# Patient Record
Sex: Male | Born: 1975 | Race: White | Hispanic: No | Marital: Married | Smoking: Former smoker
Health system: Southern US, Academic
[De-identification: ages and names within clinical notes are randomized; demographics above are authoritative.]

## PROBLEM LIST (undated history)

## (undated) DIAGNOSIS — F32A Depression, unspecified: Secondary | ICD-10-CM

## (undated) DIAGNOSIS — J45909 Unspecified asthma, uncomplicated: Secondary | ICD-10-CM

## (undated) DIAGNOSIS — K219 Gastro-esophageal reflux disease without esophagitis: Secondary | ICD-10-CM

## (undated) DIAGNOSIS — G4730 Sleep apnea, unspecified: Secondary | ICD-10-CM

## (undated) DIAGNOSIS — I10 Essential (primary) hypertension: Secondary | ICD-10-CM

## (undated) DIAGNOSIS — F419 Anxiety disorder, unspecified: Secondary | ICD-10-CM

## (undated) DIAGNOSIS — K760 Fatty (change of) liver, not elsewhere classified: Secondary | ICD-10-CM

## (undated) DIAGNOSIS — G8929 Other chronic pain: Secondary | ICD-10-CM

## (undated) DIAGNOSIS — J189 Pneumonia, unspecified organism: Secondary | ICD-10-CM

## (undated) DIAGNOSIS — M1990 Unspecified osteoarthritis, unspecified site: Secondary | ICD-10-CM

## (undated) DIAGNOSIS — G709 Myoneural disorder, unspecified: Secondary | ICD-10-CM

## (undated) HISTORY — DX: Gastro-esophageal reflux disease without esophagitis: K21.9

## (undated) HISTORY — PX: SHOULDER SURGERY: SHX246

## (undated) HISTORY — PX: TONSILLECTOMY: SUR1361

## (undated) HISTORY — DX: Unspecified asthma, uncomplicated: J45.909

## (undated) HISTORY — DX: Sleep apnea, unspecified: G47.30

## (undated) HISTORY — DX: Other chronic pain: G89.29

## (undated) HISTORY — DX: Essential (primary) hypertension: I10

## (undated) HISTORY — PX: SHOULDER OPEN ROTATOR CUFF REPAIR: SHX2407

## (undated) HISTORY — PX: HX GALL BLADDER SURGERY/CHOLE: SHX55

## (undated) HISTORY — DX: Depression, unspecified: F32.A

## (undated) HISTORY — DX: Anxiety disorder, unspecified: F41.9

## (undated) HISTORY — PX: HX KNEE REPLACMENT: SHX125

---

## 2000-06-11 ENCOUNTER — Inpatient Hospital Stay (HOSPITAL_COMMUNITY): Admission: RE | Admit: 2000-06-11 | Discharge: 2000-06-12 | Payer: Self-pay | Admitting: Orthopaedic Surgery

## 2016-07-22 DIAGNOSIS — I10 Essential (primary) hypertension: Secondary | ICD-10-CM | POA: Diagnosis not present

## 2016-07-22 DIAGNOSIS — Z6837 Body mass index (BMI) 37.0-37.9, adult: Secondary | ICD-10-CM | POA: Diagnosis not present

## 2016-07-22 DIAGNOSIS — J45909 Unspecified asthma, uncomplicated: Secondary | ICD-10-CM | POA: Diagnosis not present

## 2016-07-22 DIAGNOSIS — K219 Gastro-esophageal reflux disease without esophagitis: Secondary | ICD-10-CM | POA: Diagnosis not present

## 2016-08-12 DIAGNOSIS — I10 Essential (primary) hypertension: Secondary | ICD-10-CM | POA: Diagnosis not present

## 2016-08-12 DIAGNOSIS — Z6836 Body mass index (BMI) 36.0-36.9, adult: Secondary | ICD-10-CM | POA: Diagnosis not present

## 2016-11-05 DIAGNOSIS — Z6835 Body mass index (BMI) 35.0-35.9, adult: Secondary | ICD-10-CM | POA: Diagnosis not present

## 2016-11-05 DIAGNOSIS — G4733 Obstructive sleep apnea (adult) (pediatric): Secondary | ICD-10-CM | POA: Diagnosis not present

## 2016-11-05 DIAGNOSIS — Z1389 Encounter for screening for other disorder: Secondary | ICD-10-CM | POA: Diagnosis not present

## 2016-11-05 DIAGNOSIS — J45909 Unspecified asthma, uncomplicated: Secondary | ICD-10-CM | POA: Diagnosis not present

## 2016-11-05 DIAGNOSIS — I10 Essential (primary) hypertension: Secondary | ICD-10-CM | POA: Diagnosis not present

## 2017-05-02 DIAGNOSIS — K219 Gastro-esophageal reflux disease without esophagitis: Secondary | ICD-10-CM | POA: Diagnosis not present

## 2017-05-02 DIAGNOSIS — I10 Essential (primary) hypertension: Secondary | ICD-10-CM | POA: Diagnosis not present

## 2017-05-02 DIAGNOSIS — R030 Elevated blood-pressure reading, without diagnosis of hypertension: Secondary | ICD-10-CM | POA: Diagnosis not present

## 2017-05-02 DIAGNOSIS — G4733 Obstructive sleep apnea (adult) (pediatric): Secondary | ICD-10-CM | POA: Diagnosis not present

## 2017-05-06 DIAGNOSIS — G4733 Obstructive sleep apnea (adult) (pediatric): Secondary | ICD-10-CM | POA: Diagnosis not present

## 2017-05-06 DIAGNOSIS — I10 Essential (primary) hypertension: Secondary | ICD-10-CM | POA: Diagnosis not present

## 2017-05-06 DIAGNOSIS — Z6835 Body mass index (BMI) 35.0-35.9, adult: Secondary | ICD-10-CM | POA: Diagnosis not present

## 2017-05-06 DIAGNOSIS — J45909 Unspecified asthma, uncomplicated: Secondary | ICD-10-CM | POA: Diagnosis not present

## 2017-08-08 DIAGNOSIS — J309 Allergic rhinitis, unspecified: Secondary | ICD-10-CM | POA: Diagnosis not present

## 2017-08-08 DIAGNOSIS — Z6836 Body mass index (BMI) 36.0-36.9, adult: Secondary | ICD-10-CM | POA: Diagnosis not present

## 2017-08-08 DIAGNOSIS — K219 Gastro-esophageal reflux disease without esophagitis: Secondary | ICD-10-CM | POA: Diagnosis not present

## 2017-08-08 DIAGNOSIS — I10 Essential (primary) hypertension: Secondary | ICD-10-CM | POA: Diagnosis not present

## 2017-09-11 ENCOUNTER — Ambulatory Visit (INDEPENDENT_AMBULATORY_CARE_PROVIDER_SITE_OTHER): Payer: BLUE CROSS/BLUE SHIELD | Admitting: Otolaryngology

## 2017-09-11 DIAGNOSIS — H900 Conductive hearing loss, bilateral: Secondary | ICD-10-CM

## 2018-01-30 DIAGNOSIS — R300 Dysuria: Secondary | ICD-10-CM | POA: Diagnosis not present

## 2018-01-30 DIAGNOSIS — Z6831 Body mass index (BMI) 31.0-31.9, adult: Secondary | ICD-10-CM | POA: Diagnosis not present

## 2018-01-30 DIAGNOSIS — R071 Chest pain on breathing: Secondary | ICD-10-CM | POA: Diagnosis not present

## 2018-02-02 DIAGNOSIS — Z6831 Body mass index (BMI) 31.0-31.9, adult: Secondary | ICD-10-CM | POA: Diagnosis not present

## 2018-02-02 DIAGNOSIS — R071 Chest pain on breathing: Secondary | ICD-10-CM | POA: Diagnosis not present

## 2018-02-03 DIAGNOSIS — R0602 Shortness of breath: Secondary | ICD-10-CM | POA: Diagnosis not present

## 2018-02-03 DIAGNOSIS — J8410 Pulmonary fibrosis, unspecified: Secondary | ICD-10-CM | POA: Diagnosis not present

## 2018-02-03 DIAGNOSIS — S299XXA Unspecified injury of thorax, initial encounter: Secondary | ICD-10-CM | POA: Diagnosis not present

## 2018-05-04 DIAGNOSIS — G4733 Obstructive sleep apnea (adult) (pediatric): Secondary | ICD-10-CM | POA: Diagnosis not present

## 2018-05-04 DIAGNOSIS — I10 Essential (primary) hypertension: Secondary | ICD-10-CM | POA: Diagnosis not present

## 2018-05-04 DIAGNOSIS — K219 Gastro-esophageal reflux disease without esophagitis: Secondary | ICD-10-CM | POA: Diagnosis not present

## 2018-05-04 DIAGNOSIS — R030 Elevated blood-pressure reading, without diagnosis of hypertension: Secondary | ICD-10-CM | POA: Diagnosis not present

## 2018-05-06 DIAGNOSIS — F419 Anxiety disorder, unspecified: Secondary | ICD-10-CM | POA: Diagnosis not present

## 2018-05-06 DIAGNOSIS — I10 Essential (primary) hypertension: Secondary | ICD-10-CM | POA: Diagnosis not present

## 2018-05-06 DIAGNOSIS — G252 Other specified forms of tremor: Secondary | ICD-10-CM | POA: Diagnosis not present

## 2018-05-06 DIAGNOSIS — Z1331 Encounter for screening for depression: Secondary | ICD-10-CM | POA: Diagnosis not present

## 2018-05-06 DIAGNOSIS — N529 Male erectile dysfunction, unspecified: Secondary | ICD-10-CM | POA: Diagnosis not present

## 2018-05-06 DIAGNOSIS — J45909 Unspecified asthma, uncomplicated: Secondary | ICD-10-CM | POA: Diagnosis not present

## 2018-06-18 DIAGNOSIS — Z6834 Body mass index (BMI) 34.0-34.9, adult: Secondary | ICD-10-CM | POA: Diagnosis not present

## 2018-06-18 DIAGNOSIS — N529 Male erectile dysfunction, unspecified: Secondary | ICD-10-CM | POA: Diagnosis not present

## 2018-06-18 DIAGNOSIS — F419 Anxiety disorder, unspecified: Secondary | ICD-10-CM | POA: Diagnosis not present

## 2018-06-18 DIAGNOSIS — K219 Gastro-esophageal reflux disease without esophagitis: Secondary | ICD-10-CM | POA: Diagnosis not present

## 2018-06-18 DIAGNOSIS — R030 Elevated blood-pressure reading, without diagnosis of hypertension: Secondary | ICD-10-CM | POA: Diagnosis not present

## 2018-10-13 DIAGNOSIS — S134XXA Sprain of ligaments of cervical spine, initial encounter: Secondary | ICD-10-CM | POA: Diagnosis not present

## 2018-10-13 DIAGNOSIS — M546 Pain in thoracic spine: Secondary | ICD-10-CM | POA: Diagnosis not present

## 2018-10-13 DIAGNOSIS — S39012A Strain of muscle, fascia and tendon of lower back, initial encounter: Secondary | ICD-10-CM | POA: Diagnosis not present

## 2018-10-15 DIAGNOSIS — S134XXA Sprain of ligaments of cervical spine, initial encounter: Secondary | ICD-10-CM | POA: Diagnosis not present

## 2018-10-15 DIAGNOSIS — M546 Pain in thoracic spine: Secondary | ICD-10-CM | POA: Diagnosis not present

## 2018-10-15 DIAGNOSIS — S39012A Strain of muscle, fascia and tendon of lower back, initial encounter: Secondary | ICD-10-CM | POA: Diagnosis not present

## 2018-10-21 DIAGNOSIS — S39012A Strain of muscle, fascia and tendon of lower back, initial encounter: Secondary | ICD-10-CM | POA: Diagnosis not present

## 2018-10-21 DIAGNOSIS — M546 Pain in thoracic spine: Secondary | ICD-10-CM | POA: Diagnosis not present

## 2018-10-21 DIAGNOSIS — S233XXA Sprain of ligaments of thoracic spine, initial encounter: Secondary | ICD-10-CM | POA: Diagnosis not present

## 2018-10-21 DIAGNOSIS — M4317 Spondylolisthesis, lumbosacral region: Secondary | ICD-10-CM | POA: Diagnosis not present

## 2018-10-21 DIAGNOSIS — S134XXA Sprain of ligaments of cervical spine, initial encounter: Secondary | ICD-10-CM | POA: Diagnosis not present

## 2018-10-26 DIAGNOSIS — M546 Pain in thoracic spine: Secondary | ICD-10-CM | POA: Diagnosis not present

## 2018-10-26 DIAGNOSIS — S39012A Strain of muscle, fascia and tendon of lower back, initial encounter: Secondary | ICD-10-CM | POA: Diagnosis not present

## 2018-10-26 DIAGNOSIS — S233XXA Sprain of ligaments of thoracic spine, initial encounter: Secondary | ICD-10-CM | POA: Diagnosis not present

## 2018-10-26 DIAGNOSIS — S134XXA Sprain of ligaments of cervical spine, initial encounter: Secondary | ICD-10-CM | POA: Diagnosis not present

## 2018-10-26 DIAGNOSIS — M4317 Spondylolisthesis, lumbosacral region: Secondary | ICD-10-CM | POA: Diagnosis not present

## 2018-10-29 DIAGNOSIS — M4317 Spondylolisthesis, lumbosacral region: Secondary | ICD-10-CM | POA: Diagnosis not present

## 2018-10-29 DIAGNOSIS — S134XXA Sprain of ligaments of cervical spine, initial encounter: Secondary | ICD-10-CM | POA: Diagnosis not present

## 2018-10-29 DIAGNOSIS — S233XXA Sprain of ligaments of thoracic spine, initial encounter: Secondary | ICD-10-CM | POA: Diagnosis not present

## 2018-11-02 DIAGNOSIS — S134XXA Sprain of ligaments of cervical spine, initial encounter: Secondary | ICD-10-CM | POA: Diagnosis not present

## 2018-11-02 DIAGNOSIS — S233XXA Sprain of ligaments of thoracic spine, initial encounter: Secondary | ICD-10-CM | POA: Diagnosis not present

## 2018-11-02 DIAGNOSIS — M4317 Spondylolisthesis, lumbosacral region: Secondary | ICD-10-CM | POA: Diagnosis not present

## 2018-11-04 DIAGNOSIS — S39012A Strain of muscle, fascia and tendon of lower back, initial encounter: Secondary | ICD-10-CM | POA: Diagnosis not present

## 2018-11-04 DIAGNOSIS — M546 Pain in thoracic spine: Secondary | ICD-10-CM | POA: Diagnosis not present

## 2018-11-04 DIAGNOSIS — S134XXA Sprain of ligaments of cervical spine, initial encounter: Secondary | ICD-10-CM | POA: Diagnosis not present

## 2018-11-05 DIAGNOSIS — M4317 Spondylolisthesis, lumbosacral region: Secondary | ICD-10-CM | POA: Diagnosis not present

## 2018-11-05 DIAGNOSIS — S134XXA Sprain of ligaments of cervical spine, initial encounter: Secondary | ICD-10-CM | POA: Diagnosis not present

## 2018-11-05 DIAGNOSIS — S233XXA Sprain of ligaments of thoracic spine, initial encounter: Secondary | ICD-10-CM | POA: Diagnosis not present

## 2018-12-17 DIAGNOSIS — I10 Essential (primary) hypertension: Secondary | ICD-10-CM | POA: Diagnosis not present

## 2018-12-17 DIAGNOSIS — K219 Gastro-esophageal reflux disease without esophagitis: Secondary | ICD-10-CM | POA: Diagnosis not present

## 2018-12-17 DIAGNOSIS — G4733 Obstructive sleep apnea (adult) (pediatric): Secondary | ICD-10-CM | POA: Diagnosis not present

## 2018-12-22 DIAGNOSIS — F419 Anxiety disorder, unspecified: Secondary | ICD-10-CM | POA: Diagnosis not present

## 2018-12-22 DIAGNOSIS — N529 Male erectile dysfunction, unspecified: Secondary | ICD-10-CM | POA: Diagnosis not present

## 2018-12-22 DIAGNOSIS — Z6833 Body mass index (BMI) 33.0-33.9, adult: Secondary | ICD-10-CM | POA: Diagnosis not present

## 2019-05-06 DIAGNOSIS — Z13220 Encounter for screening for lipoid disorders: Secondary | ICD-10-CM | POA: Diagnosis not present

## 2019-05-06 DIAGNOSIS — G4733 Obstructive sleep apnea (adult) (pediatric): Secondary | ICD-10-CM | POA: Diagnosis not present

## 2019-05-06 DIAGNOSIS — I10 Essential (primary) hypertension: Secondary | ICD-10-CM | POA: Diagnosis not present

## 2019-05-06 DIAGNOSIS — K219 Gastro-esophageal reflux disease without esophagitis: Secondary | ICD-10-CM | POA: Diagnosis not present

## 2019-05-06 DIAGNOSIS — F419 Anxiety disorder, unspecified: Secondary | ICD-10-CM | POA: Diagnosis not present

## 2019-05-11 DIAGNOSIS — Z1389 Encounter for screening for other disorder: Secondary | ICD-10-CM | POA: Diagnosis not present

## 2019-05-11 DIAGNOSIS — Z6834 Body mass index (BMI) 34.0-34.9, adult: Secondary | ICD-10-CM | POA: Diagnosis not present

## 2019-05-11 DIAGNOSIS — N529 Male erectile dysfunction, unspecified: Secondary | ICD-10-CM | POA: Diagnosis not present

## 2019-05-11 DIAGNOSIS — Z1331 Encounter for screening for depression: Secondary | ICD-10-CM | POA: Diagnosis not present

## 2019-05-11 DIAGNOSIS — F419 Anxiety disorder, unspecified: Secondary | ICD-10-CM | POA: Diagnosis not present

## 2019-05-11 DIAGNOSIS — M25512 Pain in left shoulder: Secondary | ICD-10-CM | POA: Diagnosis not present

## 2019-05-14 DIAGNOSIS — K625 Hemorrhage of anus and rectum: Secondary | ICD-10-CM | POA: Diagnosis not present

## 2019-05-14 DIAGNOSIS — K649 Unspecified hemorrhoids: Secondary | ICD-10-CM | POA: Diagnosis not present

## 2019-05-14 DIAGNOSIS — R1032 Left lower quadrant pain: Secondary | ICD-10-CM | POA: Diagnosis not present

## 2019-05-14 DIAGNOSIS — Z6835 Body mass index (BMI) 35.0-35.9, adult: Secondary | ICD-10-CM | POA: Diagnosis not present

## 2019-05-24 DIAGNOSIS — Z01818 Encounter for other preprocedural examination: Secondary | ICD-10-CM | POA: Diagnosis not present

## 2019-05-26 DIAGNOSIS — K635 Polyp of colon: Secondary | ICD-10-CM | POA: Diagnosis not present

## 2019-05-26 DIAGNOSIS — D128 Benign neoplasm of rectum: Secondary | ICD-10-CM | POA: Diagnosis not present

## 2019-05-26 DIAGNOSIS — R109 Unspecified abdominal pain: Secondary | ICD-10-CM | POA: Diagnosis not present

## 2019-05-26 DIAGNOSIS — K621 Rectal polyp: Secondary | ICD-10-CM | POA: Diagnosis not present

## 2019-05-26 DIAGNOSIS — K625 Hemorrhage of anus and rectum: Secondary | ICD-10-CM | POA: Diagnosis not present

## 2019-05-26 DIAGNOSIS — R1032 Left lower quadrant pain: Secondary | ICD-10-CM | POA: Diagnosis not present

## 2019-05-26 DIAGNOSIS — K6289 Other specified diseases of anus and rectum: Secondary | ICD-10-CM | POA: Diagnosis not present

## 2019-05-26 DIAGNOSIS — N400 Enlarged prostate without lower urinary tract symptoms: Secondary | ICD-10-CM | POA: Diagnosis not present

## 2019-05-26 HISTORY — PX: COLONOSCOPY: SHX174

## 2019-05-28 HISTORY — PX: SPINAL FUSION: SHX223

## 2019-06-11 DIAGNOSIS — D124 Benign neoplasm of descending colon: Secondary | ICD-10-CM | POA: Insufficient documentation

## 2019-06-11 DIAGNOSIS — Z125 Encounter for screening for malignant neoplasm of prostate: Secondary | ICD-10-CM | POA: Diagnosis not present

## 2019-06-11 DIAGNOSIS — N400 Enlarged prostate without lower urinary tract symptoms: Secondary | ICD-10-CM | POA: Diagnosis not present

## 2019-06-15 DIAGNOSIS — Z6839 Body mass index (BMI) 39.0-39.9, adult: Secondary | ICD-10-CM | POA: Diagnosis not present

## 2019-06-15 DIAGNOSIS — R109 Unspecified abdominal pain: Secondary | ICD-10-CM | POA: Diagnosis not present

## 2019-06-15 DIAGNOSIS — R197 Diarrhea, unspecified: Secondary | ICD-10-CM | POA: Diagnosis not present

## 2019-06-15 DIAGNOSIS — R1032 Left lower quadrant pain: Secondary | ICD-10-CM | POA: Diagnosis not present

## 2019-06-17 DIAGNOSIS — I700 Atherosclerosis of aorta: Secondary | ICD-10-CM | POA: Diagnosis not present

## 2019-06-17 DIAGNOSIS — K5730 Diverticulosis of large intestine without perforation or abscess without bleeding: Secondary | ICD-10-CM | POA: Diagnosis not present

## 2019-06-17 DIAGNOSIS — I517 Cardiomegaly: Secondary | ICD-10-CM | POA: Diagnosis not present

## 2019-06-17 DIAGNOSIS — R109 Unspecified abdominal pain: Secondary | ICD-10-CM | POA: Diagnosis not present

## 2019-06-17 DIAGNOSIS — R197 Diarrhea, unspecified: Secondary | ICD-10-CM | POA: Diagnosis not present

## 2019-06-17 DIAGNOSIS — R1032 Left lower quadrant pain: Secondary | ICD-10-CM | POA: Diagnosis not present

## 2019-06-24 DIAGNOSIS — K219 Gastro-esophageal reflux disease without esophagitis: Secondary | ICD-10-CM | POA: Diagnosis not present

## 2019-06-24 DIAGNOSIS — Z6835 Body mass index (BMI) 35.0-35.9, adult: Secondary | ICD-10-CM | POA: Diagnosis not present

## 2019-06-24 DIAGNOSIS — M545 Low back pain: Secondary | ICD-10-CM | POA: Diagnosis not present

## 2019-06-30 DIAGNOSIS — M4310 Spondylolisthesis, site unspecified: Secondary | ICD-10-CM | POA: Diagnosis not present

## 2019-06-30 DIAGNOSIS — M545 Low back pain: Secondary | ICD-10-CM | POA: Diagnosis not present

## 2019-07-06 DIAGNOSIS — K2289 Other specified disease of esophagus: Secondary | ICD-10-CM | POA: Insufficient documentation

## 2019-07-06 DIAGNOSIS — R9389 Abnormal findings on diagnostic imaging of other specified body structures: Secondary | ICD-10-CM | POA: Insufficient documentation

## 2019-07-06 DIAGNOSIS — K219 Gastro-esophageal reflux disease without esophagitis: Secondary | ICD-10-CM | POA: Insufficient documentation

## 2019-07-06 DIAGNOSIS — M4310 Spondylolisthesis, site unspecified: Secondary | ICD-10-CM | POA: Diagnosis not present

## 2019-07-06 DIAGNOSIS — K228 Other specified diseases of esophagus: Secondary | ICD-10-CM | POA: Diagnosis not present

## 2019-07-06 DIAGNOSIS — Z6836 Body mass index (BMI) 36.0-36.9, adult: Secondary | ICD-10-CM | POA: Diagnosis not present

## 2019-07-06 DIAGNOSIS — M545 Low back pain: Secondary | ICD-10-CM | POA: Diagnosis not present

## 2019-07-09 DIAGNOSIS — M4310 Spondylolisthesis, site unspecified: Secondary | ICD-10-CM | POA: Diagnosis not present

## 2019-07-09 DIAGNOSIS — Z01818 Encounter for other preprocedural examination: Secondary | ICD-10-CM | POA: Diagnosis not present

## 2019-07-09 DIAGNOSIS — M545 Low back pain: Secondary | ICD-10-CM | POA: Diagnosis not present

## 2019-07-12 DIAGNOSIS — K2900 Acute gastritis without bleeding: Secondary | ICD-10-CM | POA: Diagnosis not present

## 2019-07-12 DIAGNOSIS — Z880 Allergy status to penicillin: Secondary | ICD-10-CM | POA: Diagnosis not present

## 2019-07-12 DIAGNOSIS — K2990 Gastroduodenitis, unspecified, without bleeding: Secondary | ICD-10-CM | POA: Diagnosis not present

## 2019-07-12 DIAGNOSIS — K228 Other specified diseases of esophagus: Secondary | ICD-10-CM | POA: Diagnosis not present

## 2019-07-12 DIAGNOSIS — K2980 Duodenitis without bleeding: Secondary | ICD-10-CM | POA: Diagnosis not present

## 2019-07-12 DIAGNOSIS — G4730 Sleep apnea, unspecified: Secondary | ICD-10-CM | POA: Diagnosis not present

## 2019-07-12 DIAGNOSIS — K219 Gastro-esophageal reflux disease without esophagitis: Secondary | ICD-10-CM | POA: Diagnosis not present

## 2019-07-12 DIAGNOSIS — J45909 Unspecified asthma, uncomplicated: Secondary | ICD-10-CM | POA: Diagnosis not present

## 2019-07-12 DIAGNOSIS — Z882 Allergy status to sulfonamides status: Secondary | ICD-10-CM | POA: Diagnosis not present

## 2019-07-12 DIAGNOSIS — K317 Polyp of stomach and duodenum: Secondary | ICD-10-CM | POA: Diagnosis not present

## 2019-07-12 DIAGNOSIS — I10 Essential (primary) hypertension: Secondary | ICD-10-CM | POA: Diagnosis not present

## 2019-07-12 DIAGNOSIS — Z79899 Other long term (current) drug therapy: Secondary | ICD-10-CM | POA: Diagnosis not present

## 2019-07-12 HISTORY — PX: ESOPHAGOGASTRODUODENOSCOPY: SHX1529

## 2019-07-14 DIAGNOSIS — M4310 Spondylolisthesis, site unspecified: Secondary | ICD-10-CM | POA: Diagnosis not present

## 2019-07-14 DIAGNOSIS — M545 Low back pain: Secondary | ICD-10-CM | POA: Diagnosis not present

## 2019-07-16 DIAGNOSIS — M4310 Spondylolisthesis, site unspecified: Secondary | ICD-10-CM | POA: Diagnosis not present

## 2019-07-16 DIAGNOSIS — M545 Low back pain: Secondary | ICD-10-CM | POA: Diagnosis not present

## 2019-07-21 DIAGNOSIS — M4310 Spondylolisthesis, site unspecified: Secondary | ICD-10-CM | POA: Diagnosis not present

## 2019-07-21 DIAGNOSIS — M545 Low back pain: Secondary | ICD-10-CM | POA: Diagnosis not present

## 2019-07-23 DIAGNOSIS — M545 Low back pain: Secondary | ICD-10-CM | POA: Diagnosis not present

## 2019-07-23 DIAGNOSIS — M4310 Spondylolisthesis, site unspecified: Secondary | ICD-10-CM | POA: Diagnosis not present

## 2019-07-28 DIAGNOSIS — M4310 Spondylolisthesis, site unspecified: Secondary | ICD-10-CM | POA: Diagnosis not present

## 2019-07-28 DIAGNOSIS — M545 Low back pain: Secondary | ICD-10-CM | POA: Diagnosis not present

## 2019-07-30 DIAGNOSIS — R9389 Abnormal findings on diagnostic imaging of other specified body structures: Secondary | ICD-10-CM | POA: Diagnosis not present

## 2019-07-30 DIAGNOSIS — M4310 Spondylolisthesis, site unspecified: Secondary | ICD-10-CM | POA: Diagnosis not present

## 2019-07-30 DIAGNOSIS — K228 Other specified diseases of esophagus: Secondary | ICD-10-CM | POA: Diagnosis not present

## 2019-07-30 DIAGNOSIS — K219 Gastro-esophageal reflux disease without esophagitis: Secondary | ICD-10-CM | POA: Diagnosis not present

## 2019-07-30 DIAGNOSIS — M545 Low back pain: Secondary | ICD-10-CM | POA: Diagnosis not present

## 2019-08-03 ENCOUNTER — Encounter: Payer: Self-pay | Admitting: Internal Medicine

## 2019-08-04 DIAGNOSIS — M545 Low back pain: Secondary | ICD-10-CM | POA: Diagnosis not present

## 2019-08-04 DIAGNOSIS — M4310 Spondylolisthesis, site unspecified: Secondary | ICD-10-CM | POA: Diagnosis not present

## 2019-08-11 DIAGNOSIS — M4310 Spondylolisthesis, site unspecified: Secondary | ICD-10-CM | POA: Diagnosis not present

## 2019-08-11 DIAGNOSIS — M545 Low back pain: Secondary | ICD-10-CM | POA: Diagnosis not present

## 2019-08-13 DIAGNOSIS — M4310 Spondylolisthesis, site unspecified: Secondary | ICD-10-CM | POA: Diagnosis not present

## 2019-08-13 DIAGNOSIS — M545 Low back pain: Secondary | ICD-10-CM | POA: Diagnosis not present

## 2019-08-16 DIAGNOSIS — K219 Gastro-esophageal reflux disease without esophagitis: Secondary | ICD-10-CM | POA: Diagnosis not present

## 2019-08-16 DIAGNOSIS — M4310 Spondylolisthesis, site unspecified: Secondary | ICD-10-CM | POA: Diagnosis not present

## 2019-08-16 DIAGNOSIS — M4300 Spondylolysis, site unspecified: Secondary | ICD-10-CM | POA: Diagnosis not present

## 2019-08-16 DIAGNOSIS — M545 Low back pain: Secondary | ICD-10-CM | POA: Diagnosis not present

## 2019-08-17 NOTE — Progress Notes (Deleted)
Referring Provider: Adelina Mings, MD Primary Care Physician:  Rory Percy, MD Primary Gastroenterologist:  Dr. Gala Romney  No chief complaint on file.   HPI:   Phillip Chen is a 44 y.o. male presenting today at the request of Adelina Mings, MD for GERD and acid reflux.  Patient underwent colonoscopy on 05/26/2019 with Dr. Rocco Serene for left lower quadrant pain, rectal bleeding.  Findings included 2 hyperplastic polyps (0.5 cm and 0.4 cm), 4 fragments of tubular adenoma (0.3-0.6 cm), diverticulosis.  Recommended repeat colonoscopy in 3 years.  EGD on 07/12/2019 with Dr. Rocco Serene for esophageal thickening noted on outside CT that was completed on 06/17/2019.  He also reported history of reflux that was fairly well controlled but did note occasionally waking with choking and feeling fluid in his voicebox.  EGD revealed moderate acute gastritis, duodenitis, fundic gland polyp with benign pathology.  CLOtest negative.  No hiatal hernia.  Noted bile in his stomach which was seen reflux into the esophagus and up to the level of his vocal cords.  He was referred to GI due to extent of reflux.  Today:   We will need to consider impedance with pH testing versus increasing or changing PPI to twice daily dosing and/or adding H2 blocker.  No past medical history on file.  *** The histories are not reviewed yet. Please review them in the "History" navigator section and refresh this [place].  No current outpatient medications on file.   No current facility-administered medications for this visit.    Allergies as of 08/19/2019  . (Not on File)    No family history on file.  Social History   Socioeconomic History  . Marital status: Married    Spouse name: Not on file  . Number of children: Not on file  . Years of education: Not on file  . Highest education level: Not on file  Occupational History  . Not on file  Tobacco Use  . Smoking status: Not on file  Substance and Sexual  Activity  . Alcohol use: Not on file  . Drug use: Not on file  . Sexual activity: Not on file  Other Topics Concern  . Not on file  Social History Narrative  . Not on file   Social Determinants of Health   Financial Resource Strain:   . Difficulty of Paying Living Expenses:   Food Insecurity:   . Worried About Charity fundraiser in the Last Year:   . Arboriculturist in the Last Year:   Transportation Needs:   . Film/video editor (Medical):   Marland Kitchen Lack of Transportation (Non-Medical):   Physical Activity:   . Days of Exercise per Week:   . Minutes of Exercise per Session:   Stress:   . Feeling of Stress :   Social Connections:   . Frequency of Communication with Friends and Family:   . Frequency of Social Gatherings with Friends and Family:   . Attends Religious Services:   . Active Member of Clubs or Organizations:   . Attends Archivist Meetings:   Marland Kitchen Marital Status:   Intimate Partner Violence:   . Fear of Current or Ex-Partner:   . Emotionally Abused:   Marland Kitchen Physically Abused:   . Sexually Abused:     Review of Systems: Gen: Denies any fever, chills, fatigue, weight loss, lack of appetite.  CV: Denies chest pain, heart palpitations, peripheral edema, syncope.  Resp: Denies shortness of breath at rest or with  exertion. Denies wheezing or cough.  GI: Denies dysphagia or odynophagia. Denies jaundice, hematemesis, fecal incontinence. GU : Denies urinary burning, urinary frequency, urinary hesitancy MS: Denies joint pain, muscle weakness, cramps, or limitation of movement.  Derm: Denies rash, itching, dry skin Psych: Denies depression, anxiety, memory loss, and confusion Heme: Denies bruising, bleeding, and enlarged lymph nodes.  Physical Exam: There were no vitals taken for this visit. General:   Alert and oriented. Pleasant and cooperative. Well-nourished and well-developed.  Head:  Normocephalic and atraumatic. Eyes:  Without icterus, sclera clear and  conjunctiva pink.  Ears:  Normal auditory acuity. Nose:  No deformity, discharge,  or lesions. Mouth:  No deformity or lesions, oral mucosa pink.  Neck:  Supple, without mass or thyromegaly. Lungs:  Clear to auscultation bilaterally. No wheezes, rales, or rhonchi. No distress.  Heart:  S1, S2 present without murmurs appreciated.  Abdomen:  +BS, soft, non-tender and non-distended. No HSM noted. No guarding or rebound. No masses appreciated.  Rectal:  Deferred  Msk:  Symmetrical without gross deformities. Normal posture. Pulses:  Normal pulses noted. Extremities:  Without clubbing or edema. Neurologic:  Alert and  oriented x4;  grossly normal neurologically. Skin:  Intact without significant lesions or rashes. Cervical Nodes:  No significant cervical adenopathy. Psych:  Alert and cooperative. Normal mood and affect.

## 2019-08-19 ENCOUNTER — Encounter: Payer: Self-pay | Admitting: Gastroenterology

## 2019-08-19 ENCOUNTER — Encounter: Payer: Self-pay | Admitting: Internal Medicine

## 2019-08-19 ENCOUNTER — Other Ambulatory Visit: Payer: Self-pay

## 2019-08-19 ENCOUNTER — Ambulatory Visit (INDEPENDENT_AMBULATORY_CARE_PROVIDER_SITE_OTHER): Payer: BC Managed Care – PPO | Admitting: Gastroenterology

## 2019-08-19 ENCOUNTER — Ambulatory Visit: Payer: BLUE CROSS/BLUE SHIELD | Admitting: Gastroenterology

## 2019-08-19 DIAGNOSIS — K219 Gastro-esophageal reflux disease without esophagitis: Secondary | ICD-10-CM | POA: Diagnosis not present

## 2019-08-19 MED ORDER — OMEPRAZOLE 20 MG PO CPDR: 20.0000 mg | DELAYED_RELEASE_CAPSULE | Freq: Two times a day (BID) | ORAL | 3 refills | Status: DC

## 2019-08-19 NOTE — Progress Notes (Signed)
Primary Care Physician:  Rory Percy, MD Primary Gastroenterologist:  Dr.  Gala Romney   Chief Complaint  Patient presents with  . Gastroesophageal Reflux    had EGD 06/2019 at [facility]; Omeprazole not helping    HPI:   Phillip Chen is a 44 y.o. male presenting today at the request of Dr. Rocco Serene due to GERD. He underwent   colonoscopy on 05/26/2019 with Dr. Rocco Serene for left lower quadrant pain, rectal bleeding.  Findings included 2 hyperplastic polyps (0.5 cm and 0.4 cm), 4 fragments of tubular adenoma (0.3-0.6 cm), diverticulosis.  Recommended repeat colonoscopy in 3 years.  EGD on 07/12/2019 with Dr. Rocco Serene for esophageal thickening noted on outside CT that was completed on 06/17/2019.  EGD revealed moderate acute gastritis, duodenitis, fundic gland polyp with benign pathology.  CLOtest negative.  No hiatal hernia.  Noted bile in his stomach which was seen reflux into the esophagus and up to the level of his vocal cords.  He was referred to GI due to extent of reflux.  History of chronic GERD. Has been on Prevacid chronically and would only have a few exacerbations. He was given something else to take in addition to this. Would take Prevacid in morning and the other before bed. Changed to Prilosec after EGD to BID. Was also prescribed Carafate. He isn't sure which one was helping. He is better with GERD now since EGD. Takes Prilosec in mornings but eats pretty soon after taking this. No dysphagia. Not as much breakthrough. No N/V. No abdominal pain. No unexplained weight loss. Has had some slow weight gain over the years. Back issues has caused decreased mobility. Drinks one cup of coffee in morning. Diet Sundrop two 16 ounce bottles a day. Loves jalapenos [employer] or rare fried foods. Mostly baked or grilled. Tries to eat between 5-6 pm.   Not much of a pill person, rarely taking NSAIDs.   Past Medical History:  Diagnosis Date  . Asthma   . Chronic back pain   . GERD (gastroesophageal  reflux disease)   . HTN (hypertension)   . Sleep apnea    CPAP    Past Surgical History:  Procedure Laterality Date  . COLONOSCOPY  05/26/2019   Dr. Rocco Serene; 2 hyperplastic polyp (0.5 cm and 0.4 cm), 4 fragments of tubular adenoma (0.3-0.6 cm), diverticulosis.  Recommended repeat colonoscopy in 3 years.  . ESOPHAGOGASTRODUODENOSCOPY  07/12/2019   Dr. Rocco Serene; moderate acute gastritis, duodenitis, fundic gland polyp with benign pathology.  CLOtest negative.  Noted bile in his stomach which was seen refluxing into the esophagus and up to the level of his vocal cords.  Marland Kitchen SHOULDER SURGERY      Current Outpatient Medications  Medication Sig Dispense Refill  . albuterol (VENTOLIN HFA) 108 (90 Base) MCG/ACT inhaler Inhale into the lungs every 6 (six) hours as needed for wheezing or shortness of breath.    . escitalopram (LEXAPRO) 10 MG tablet Take 10 mg by mouth daily.    . Fluticasone-Salmeterol (ADVAIR) 100-50 MCG/DOSE AEPB Inhale 1 puff into the lungs daily.    Marland Kitchen lisinopril-hydrochlorothiazide (ZESTORETIC) 10-12.5 MG tablet Take 1 tablet by mouth daily.    Marland Kitchen omeprazole (PRILOSEC) 20 MG capsule Take 20 mg by mouth 2 (two) times daily.    . traMADol (ULTRAM) 50 MG tablet Take 100 mg by mouth at bedtime.     No current facility-administered medications for this visit.    Allergies as of 08/19/2019 - Review Complete 08/19/2019  Allergen Reaction Noted  .  Penicillins  08/19/2019  . Sulfa antibiotics  08/19/2019    Family History  Problem Relation Age of Onset  . Diverticulitis Father   . Colon cancer Maternal Uncle     Social History   Socioeconomic History  . Marital status: Married    Spouse name: Not on file  . Number of children: Not on file  . Years of education: Not on file  . Highest education level: Not on file  Occupational History  . Not on file  Tobacco Use  . Smoking status: Former Smoker    Types: Cigarettes  . Smokeless tobacco: Never Used  Substance and  Sexual Activity  . Alcohol use: Yes    Comment: rare-once a month  . Drug use: Never  . Sexual activity: Not on file  Other Topics Concern  . Not on file  Social History Narrative  . Not on file   Social Determinants of Health   Financial Resource Strain:   . Difficulty of Paying Living Expenses:   Food Insecurity:   . Worried About Charity fundraiser in the Last Year:   . Arboriculturist in the Last Year:   Transportation Needs:   . Film/video editor (Medical):   Marland Kitchen Lack of Transportation (Non-Medical):   Physical Activity:   . Days of Exercise per Week:   . Minutes of Exercise per Session:   Stress:   . Feeling of Stress :   Social Connections:   . Frequency of Communication with Friends and Family:   . Frequency of Social Gatherings with Friends and Family:   . Attends Religious Services:   . Active Member of Clubs or Organizations:   . Attends Archivist Meetings:   Marland Kitchen Marital Status:   Intimate Partner Violence:   . Fear of Current or Ex-Partner:   . Emotionally Abused:   Marland Kitchen Physically Abused:   . Sexually Abused:     Review of Systems: Gen: Denies any fever, chills, fatigue, weight loss, lack of appetite.  CV: Denies chest pain, heart palpitations, peripheral edema, syncope.  Resp: Denies shortness of breath at rest or with exertion. Denies wheezing or cough.  GI: see HPI GU : Denies urinary burning, urinary frequency, urinary hesitancy MS: Denies joint pain, muscle weakness, cramps, or limitation of movement.  Derm: Denies rash, itching, dry skin Psych: Denies depression, anxiety, memory loss, and confusion Heme: Denies bruising, bleeding, and enlarged lymph nodes.  Physical Exam: BP (!) 141/87   Pulse 81   Temp (!) 97.1 F (36.2 C) (Temporal)   Ht 5\' 10"  (1.778 m)   Wt 256 lb 3.2 oz (116.2 kg)   BMI 36.76 kg/m  General:   Alert and oriented. Pleasant and cooperative. Well-nourished and well-developed.  Head:  Normocephalic and  atraumatic. Eyes:  Without icterus, sclera clear and conjunctiva pink.  Ears:  Normal auditory acuity. Mouth:  Mask in place Lungs:  Clear to auscultation bilaterally. No wheezes, rales, or rhonchi. No distress.  Heart:  S1, S2 present without murmurs appreciated.  Abdomen:  +BS, soft, non-tender and non-distended. No HSM noted. No guarding or rebound. No masses appreciated.  Rectal:  Deferred  Msk:  Symmetrical without gross deformities. Normal posture. Extremities:  Without edema. Neurologic:  Alert and  oriented x4;  grossly normal neurologically. Skin:  Intact without significant lesions or rashes. Psych:  Alert and cooperative. Normal mood and affect.  ASSESSMENT: Phillip Chen is a 44 y.o. male presenting today with history of  chronic GERD, recently undergoing EGD by Dr. Rocco Serene ([facility] Surgery) after CT showed esophageal wall thickening on outside imaging, which we will request. EGD as noted above with gastritis, duodenitis, fundic gland polyp with benign pathology, CLOtest negative. He has noted improvement in symptoms with BID Prilosec. Likely majority of symptoms secondary to diet/behavior, and he does endorse weight gain recently. No dysphagia or alarm signs/symptoms.   Discussed GERD diet, behavior changes, timing of omeprazole dosing. Monitor for any alarm signs/symptoms. May be able to decrease PPI to once daily but will keep BID for now.      PLAN:  Obtain CT from [place]  Continue omeprazole BID  GERD diet/behavior discussed  Return in 6-8 months  Colonoscopy 2023 due to colon polyps  Annitta Needs, PhD, ANP-BC [facility]

## 2019-08-19 NOTE — Patient Instructions (Addendum)
Continue omeprazole twice a day, 30 minutes before breakfast and dinner.   Please call if any problems swallowing, pain, loss of appetite, nausea, vomiting.  We have attached a reflux tips sheets to help reduce symptoms.  We will see you in 6-8 months!  It was a pleasure to see you today. I want to create trusting relationships with patients to provide genuine, compassionate, and quality care. I value your feedback. If you receive a survey regarding your visit,  I greatly appreciate you taking time to fill this out.   Phillip Needs, PhD, ANP-BC [facility]    Gastroesophageal Reflux Disease, Adult Gastroesophageal reflux (GER) happens when acid from the stomach flows up into the tube that connects the mouth and the stomach (esophagus). Normally, food travels down the esophagus and stays in the stomach to be digested. However, when a person has GER, food and stomach acid sometimes move back up into the esophagus. If this becomes a more serious problem, the person may be diagnosed with a disease called gastroesophageal reflux disease (GERD). GERD occurs when the reflux:  Happens often.  Causes frequent or severe symptoms.  Causes problems such as damage to the esophagus. When stomach acid comes in contact with the esophagus, the acid may cause soreness (inflammation) in the esophagus. Over time, GERD may create small holes (ulcers) in the lining of the esophagus. What are the causes? This condition is caused by a problem with the muscle between the esophagus and the stomach (lower esophageal sphincter, or LES). Normally, the LES muscle closes after food passes through the esophagus to the stomach. When the LES is weakened or abnormal, it does not close properly, and that allows food and stomach acid to go back up into the esophagus. The LES can be weakened by certain dietary substances, medicines, and medical conditions, including:  Tobacco use.  Pregnancy.  Having a  hiatal hernia.  Alcohol use.  Certain foods and beverages, such as coffee, chocolate, onions, and peppermint. What increases the risk? You are more likely to develop this condition if you:  Have an increased body weight.  Have a connective tissue disorder.  Use NSAID medicines. What are the signs or symptoms? Symptoms of this condition include:  Heartburn.  Difficult or painful swallowing.  The feeling of having a lump in the throat.  Abitter taste in the mouth.  Bad breath.  Having a large amount of saliva.  Having an upset or bloated stomach.  Belching.  Chest pain. Different conditions can cause chest pain. Make sure you see your health care provider if you experience chest pain.  Shortness of breath or wheezing.  Ongoing (chronic) cough or a night-time cough.  Wearing away of tooth enamel.  Weight loss. How is this diagnosed? Your health care provider will take a medical history and perform a physical exam. To determine if you have mild or severe GERD, your health care provider may also monitor how you respond to treatment. You may also have tests, including:  A test to examine your stomach and esophagus with a small camera (endoscopy).  A test thatmeasures the acidity level in your esophagus.  A test thatmeasures how much pressure is on your esophagus.  A barium swallow or modified barium swallow test to show the shape, size, and functioning of your esophagus. How is this treated? The goal of treatment is to help relieve your symptoms and to prevent complications. Treatment for this condition may vary depending on how severe your symptoms are. Your  health care provider may recommend:  Changes to your diet.  Medicine.  Surgery. Follow these instructions at home: Eating and drinking   Follow a diet as recommended by your health care provider. This may involve avoiding foods and drinks such as: ? Coffee and tea (with or without caffeine). ? Drinks  that containalcohol. ? Energy drinks and sports drinks. ? Carbonated drinks or sodas. ? Chocolate and cocoa. ? Peppermint and mint flavorings. ? Garlic and onions. ? Horseradish. ? Spicy and acidic foods, including peppers, chili powder, curry powder, vinegar, hot sauces, and barbecue sauce. ? Citrus fruit juices and citrus fruits, such as oranges, lemons, and limes. ? Tomato-based foods, such as red sauce, chili, salsa, and pizza with red sauce. ? Fried and fatty foods, such as donuts, french fries, potato chips, and high-fat dressings. ? High-fat meats, such as hot dogs and fatty cuts of red and white meats, such as rib eye steak, sausage, ham, and bacon. ? High-fat dairy items, such as whole milk, butter, and cream cheese.  Eat small, frequent meals instead of large meals.  Avoid drinking large amounts of liquid with your meals.  Avoid eating meals during the 2-3 hours before bedtime.  Avoid lying down right after you eat.  Do not exercise right after you eat. Lifestyle   Do not use any products that contain nicotine or tobacco, such as cigarettes, e-cigarettes, and chewing tobacco. If you need help quitting, ask your health care provider.  Try to reduce your stress by using methods such as yoga or meditation. If you need help reducing stress, ask your health care provider.  If you are overweight, reduce your weight to an amount that is healthy for you. Ask your health care provider for guidance about a safe weight loss goal. General instructions  Pay attention to any changes in your symptoms.  Take over-the-counter and prescription medicines only as told by your health care provider. Do not take aspirin, ibuprofen, or other NSAIDs unless your health care provider told you to do so.  Wear loose-fitting clothing. Do not wear anything tight around your waist that causes pressure on your abdomen.  Raise (elevate) the head of your bed about 6 inches (15 cm).  Avoid bending  over if this makes your symptoms worse.  Keep all follow-up visits as told by your health care provider. This is important. Contact a health care provider if:  You have: ? New symptoms. ? Unexplained weight loss. ? Difficulty swallowing or it hurts to swallow. ? Wheezing or a persistent cough. ? A hoarse voice.  Your symptoms do not improve with treatment. Get help right away if you:  Have pain in your arms, neck, jaw, teeth, or back.  Feel sweaty, dizzy, or light-headed.  Have chest pain or shortness of breath.  Vomit and your vomit looks like blood or coffee grounds.  Faint.  Have stool that is bloody or black.  Cannot swallow, drink, or eat. Summary  Gastroesophageal reflux happens when acid from the stomach flows up into the esophagus. GERD is a disease in which the reflux happens often, causes frequent or severe symptoms, or causes problems such as damage to the esophagus.  Treatment for this condition may vary depending on how severe your symptoms are. Your health care provider may recommend diet and lifestyle changes, medicine, or surgery.  Contact a health care provider if you have new or worsening symptoms.  Take over-the-counter and prescription medicines only as told by your health care provider. Do  not take aspirin, ibuprofen, or other NSAIDs unless your health care provider told you to do so.  Keep all follow-up visits as told by your health care provider. This is important. This information is not intended to replace advice given to you by your health care provider. Make sure you discuss any questions you have with your health care provider. Document Revised: 11/19/2017 Document Reviewed: 11/19/2017 Elsevier Patient Education  [place].

## 2019-09-01 DIAGNOSIS — M47816 Spondylosis without myelopathy or radiculopathy, lumbar region: Secondary | ICD-10-CM | POA: Diagnosis not present

## 2019-09-01 DIAGNOSIS — M545 Low back pain: Secondary | ICD-10-CM | POA: Diagnosis not present

## 2019-09-01 DIAGNOSIS — M5126 Other intervertebral disc displacement, lumbar region: Secondary | ICD-10-CM | POA: Diagnosis not present

## 2019-09-21 DIAGNOSIS — J45909 Unspecified asthma, uncomplicated: Secondary | ICD-10-CM | POA: Insufficient documentation

## 2019-09-21 DIAGNOSIS — I10 Essential (primary) hypertension: Secondary | ICD-10-CM | POA: Insufficient documentation

## 2019-09-21 DIAGNOSIS — M5136 Other intervertebral disc degeneration, lumbar region: Secondary | ICD-10-CM | POA: Insufficient documentation

## 2019-09-21 DIAGNOSIS — M4316 Spondylolisthesis, lumbar region: Secondary | ICD-10-CM | POA: Insufficient documentation

## 2019-09-21 DIAGNOSIS — M545 Low back pain: Secondary | ICD-10-CM | POA: Diagnosis not present

## 2019-10-15 ENCOUNTER — Ambulatory Visit: Payer: Self-pay | Admitting: Orthopedic Surgery

## 2019-10-18 ENCOUNTER — Other Ambulatory Visit: Payer: Self-pay

## 2019-10-19 ENCOUNTER — Other Ambulatory Visit: Payer: Self-pay

## 2019-10-26 HISTORY — PX: LUMBAR FUSION: SHX111

## 2019-11-01 ENCOUNTER — Ambulatory Visit (INDEPENDENT_AMBULATORY_CARE_PROVIDER_SITE_OTHER): Payer: BC Managed Care – PPO | Admitting: Surgery

## 2019-11-01 ENCOUNTER — Encounter: Payer: Self-pay | Admitting: Surgery

## 2019-11-01 ENCOUNTER — Ambulatory Visit: Payer: Self-pay | Admitting: Orthopedic Surgery

## 2019-11-01 ENCOUNTER — Other Ambulatory Visit: Payer: Self-pay

## 2019-11-01 VITALS — BP 120/82 | HR 77 | Temp 97.9°F | Resp 20 | Ht 70.0 in | Wt 246.0 lb

## 2019-11-01 DIAGNOSIS — M479 Spondylosis, unspecified: Secondary | ICD-10-CM | POA: Diagnosis not present

## 2019-11-01 DIAGNOSIS — M545 Low back pain: Secondary | ICD-10-CM | POA: Diagnosis not present

## 2019-11-01 NOTE — H&P (View-Only) (Signed)
[facility]ist of [place]  Patient name: Phillip Chen MRN: [id number] DOB: November 23, 1975 Sex: male   REQUESTING PROVIDER:    Dr. Rolena Infante    REASON FOR CONSULT:    Degenerative back diseae  HISTORY OF PRESENT ILLNESS:   Phillip Chen is a 44 y.o. male, who is referred for evaluation of anterior exposure at the L5-S1 disc space.  The patient has had back pain for approximately 10 years.  He recently underwent imaging which showed his pathology.  He has completed physical therapy and is still having difficulty.  Dr. Rolena Infante is seen and evaluated the patient and recommends proceeding with surgery.  The patient denies any prior abdominal surgeries.  He is a former smoker.  He is medically managed for hypertension.  PAST MEDICAL HISTORY    Past Medical History:  Diagnosis Date  . Asthma   . Chronic back pain   . GERD (gastroesophageal reflux disease)   . HTN (hypertension)   . Sleep apnea    CPAP     FAMILY HISTORY   Family History  Problem Relation Age of Onset  . Diverticulitis Father   . Colon cancer Maternal Uncle     SOCIAL HISTORY:   Social History   Socioeconomic History  . Marital status: Married    Spouse name: Not on file  . Number of children: Not on file  . Years of education: Not on file  . Highest education level: Not on file  Occupational History  . Not on file  Tobacco Use  . Smoking status: Former Smoker    Types: Cigarettes  . Smokeless tobacco: Never Used  Substance and Sexual Activity  . Alcohol use: Yes    Comment: rare-once a month  . Drug use: Never  . Sexual activity: Not on file  Other Topics Concern  . Not on file  Social History Narrative  . Not on file   Social Determinants of Health   Financial Resource Strain:   . Difficulty of Paying Living Expenses:   Food Insecurity:   . Worried About Charity fundraiser in the Last Year:   . Arboriculturist in the Last Year:    Transportation Needs:   . Film/video editor (Medical):   Marland Kitchen Lack of Transportation (Non-Medical):   Physical Activity:   . Days of Exercise per Week:   . Minutes of Exercise per Session:   Stress:   . Feeling of Stress :   Social Connections:   . Frequency of Communication with Friends and Family:   . Frequency of Social Gatherings with Friends and Family:   . Attends Religious Services:   . Active Member of Clubs or Organizations:   . Attends Archivist Meetings:   Marland Kitchen Marital Status:   Intimate Partner Violence:   . Fear of Current or Ex-Partner:   . Emotionally Abused:   Marland Kitchen Physically Abused:   . Sexually Abused:     ALLERGIES:    Allergies  Allergen Reactions  . Penicillins     Childhood allergy  . Sulfa Antibiotics     Childhood allergy     CURRENT MEDICATIONS:    Current Outpatient Medications  Medication Sig Dispense Refill  . albuterol (VENTOLIN HFA) 108 (90 Base) MCG/ACT inhaler Inhale into the lungs every 6 (six) hours as needed for wheezing or shortness of breath.    . escitalopram (LEXAPRO) 10 MG tablet Take 10 mg by mouth daily.    Marland Kitchen  Fluticasone-Salmeterol (ADVAIR) 100-50 MCG/DOSE AEPB Inhale 1 puff into the lungs daily.    Marland Kitchen lisinopril-hydrochlorothiazide (ZESTORETIC) 10-12.5 MG tablet Take 1 tablet by mouth daily.     Marland Kitchen omeprazole (PRILOSEC) 20 MG capsule Take 1 capsule (20 mg total) by mouth 2 (two) times daily before a meal. (Patient taking differently: Take 20 mg by mouth in the morning and at bedtime. ) 180 capsule 3  . traMADol (ULTRAM) 50 MG tablet Take 50-100 mg by mouth at bedtime.      No current facility-administered medications for this visit.    REVIEW OF SYSTEMS:   [X]  denotes positive finding, [ ]  denotes negative finding Cardiac  Comments:  Chest pain or chest pressure:    Shortness of breath upon exertion:    Short of breath when lying flat:    Irregular heart rhythm:        Vascular    Pain in calf, thigh, or hip  brought on by ambulation:    Pain in feet at night that wakes you up from your sleep:     Blood clot in your veins:    Leg swelling:         Pulmonary    Oxygen at home:    Productive cough:     Wheezing:         Neurologic    Sudden weakness in arms or legs:     Sudden numbness in arms or legs:     Sudden onset of difficulty speaking or slurred speech:    Temporary loss of vision in one eye:     Problems with dizziness:         Gastrointestinal    Blood in stool:      Vomited blood:         Genitourinary    Burning when urinating:     Blood in urine:        Psychiatric    Major depression:         Hematologic    Bleeding problems:    Problems with blood clotting too easily:        Skin    Rashes or ulcers:        Constitutional    Fever or chills:     PHYSICAL EXAM:   There were no vitals filed for this visit.  GENERAL: The patient is a well-nourished male, in no acute distress. The vital signs are documented above. CARDIAC: There is a regular rate and rhythm.  VASCULAR: Palpable pedal pulses PULMONARY: Nonlabored respirations ABDOMEN: Soft and non-tender with normal pitched bowel sounds.  MUSCULOSKELETAL: There are no major deformities or cyanosis. NEUROLOGIC: No focal weakness or paresthesias are detected. SKIN: There are no ulcers or rashes noted. PSYCHIATRIC: The patient has a normal affect.  STUDIES:   I have reviewed his x-rays and MRI revealing his anatomy.  ASSESSMENT and PLAN   Degenerative back disease: I discussed proceeding with anterior exposure for L5-S1 instrumentation.  I discussed a left lower quadrant transverse incision.  We discussed the risk of wound infection and hernia.  We discussed the risk of injury to the iliac artery or vein as well as the ureter.  We also discussed retrograde ejaculation.  All of his questions were answered.  He is eager to proceed with surgery.   Leia Alf, MD, FACS [facility] Specialists of  [facility] [phone] Pager [phone]

## 2019-11-01 NOTE — Progress Notes (Signed)
Vascular and Vein Specialist of [place]  Patient name: Phillip Chen MRN: [id number] DOB: July 14, 1975 Sex: male   REQUESTING PROVIDER:    Dr. Rolena Infante    REASON FOR CONSULT:    Degenerative back diseae  HISTORY OF PRESENT ILLNESS:   Phillip Chen is a 44 y.o. male, who is referred for evaluation of anterior exposure at the L5-S1 disc space.  The patient has had back pain for approximately 10 years.  He recently underwent imaging which showed his pathology.  He has completed physical therapy and is still having difficulty.  Dr. Rolena Infante is seen and evaluated the patient and recommends proceeding with surgery.  The patient denies any prior abdominal surgeries.  He is a former smoker.  He is medically managed for hypertension.  PAST MEDICAL HISTORY    Past Medical History:  Diagnosis Date  . Asthma   . Chronic back pain   . GERD (gastroesophageal reflux disease)   . HTN (hypertension)   . Sleep apnea    CPAP     FAMILY HISTORY   Family History  Problem Relation Age of Onset  . Diverticulitis Father   . Colon cancer Maternal Uncle     SOCIAL HISTORY:   Social History   Socioeconomic History  . Marital status: Married    Spouse name: Not on file  . Number of children: Not on file  . Years of education: Not on file  . Highest education level: Not on file  Occupational History  . Not on file  Tobacco Use  . Smoking status: Former Smoker    Types: Cigarettes  . Smokeless tobacco: Never Used  Substance and Sexual Activity  . Alcohol use: Yes    Comment: rare-once a month  . Drug use: Never  . Sexual activity: Not on file  Other Topics Concern  . Not on file  Social History Narrative  . Not on file   Social Determinants of Health   Financial Resource Strain:   . Difficulty of Paying Living Expenses:   Food Insecurity:   . Worried About Charity fundraiser in the Last Year:   . Arboriculturist in the Last Year:     Transportation Needs:   . Film/video editor (Medical):   Marland Kitchen Lack of Transportation (Non-Medical):   Physical Activity:   . Days of Exercise per Week:   . Minutes of Exercise per Session:   Stress:   . Feeling of Stress :   Social Connections:   . Frequency of Communication with Friends and Family:   . Frequency of Social Gatherings with Friends and Family:   . Attends Religious Services:   . Active Member of Clubs or Organizations:   . Attends Archivist Meetings:   Marland Kitchen Marital Status:   Intimate Partner Violence:   . Fear of Current or Ex-Partner:   . Emotionally Abused:   Marland Kitchen Physically Abused:   . Sexually Abused:     ALLERGIES:    Allergies  Allergen Reactions  . Penicillins     Childhood allergy  . Sulfa Antibiotics     Childhood allergy     CURRENT MEDICATIONS:    Current Outpatient Medications  Medication Sig Dispense Refill  . albuterol (VENTOLIN HFA) 108 (90 Base) MCG/ACT inhaler Inhale into the lungs every 6 (six) hours as needed for wheezing or shortness of breath.    . escitalopram (LEXAPRO) 10 MG tablet Take 10 mg by mouth daily.    Marland Kitchen  Fluticasone-Salmeterol (ADVAIR) 100-50 MCG/DOSE AEPB Inhale 1 puff into the lungs daily.    Marland Kitchen lisinopril-hydrochlorothiazide (ZESTORETIC) 10-12.5 MG tablet Take 1 tablet by mouth daily.     Marland Kitchen omeprazole (PRILOSEC) 20 MG capsule Take 1 capsule (20 mg total) by mouth 2 (two) times daily before a meal. (Patient taking differently: Take 20 mg by mouth in the morning and at bedtime. ) 180 capsule 3  . traMADol (ULTRAM) 50 MG tablet Take 50-100 mg by mouth at bedtime.      No current facility-administered medications for this visit.    REVIEW OF SYSTEMS:   [X]  denotes positive finding, [ ]  denotes negative finding Cardiac  Comments:  Chest pain or chest pressure:    Shortness of breath upon exertion:    Short of breath when lying flat:    Irregular heart rhythm:        Vascular    Pain in calf, thigh, or hip  brought on by ambulation:    Pain in feet at night that wakes you up from your sleep:     Blood clot in your veins:    Leg swelling:         Pulmonary    Oxygen at home:    Productive cough:     Wheezing:         Neurologic    Sudden weakness in arms or legs:     Sudden numbness in arms or legs:     Sudden onset of difficulty speaking or slurred speech:    Temporary loss of vision in one eye:     Problems with dizziness:         Gastrointestinal    Blood in stool:      Vomited blood:         Genitourinary    Burning when urinating:     Blood in urine:        Psychiatric    Major depression:         Hematologic    Bleeding problems:    Problems with blood clotting too easily:        Skin    Rashes or ulcers:        Constitutional    Fever or chills:     PHYSICAL EXAM:   There were no vitals filed for this visit.  GENERAL: The patient is a well-nourished male, in no acute distress. The vital signs are documented above. CARDIAC: There is a regular rate and rhythm.  VASCULAR: Palpable pedal pulses PULMONARY: Nonlabored respirations ABDOMEN: Soft and non-tender with normal pitched bowel sounds.  MUSCULOSKELETAL: There are no major deformities or cyanosis. NEUROLOGIC: No focal weakness or paresthesias are detected. SKIN: There are no ulcers or rashes noted. PSYCHIATRIC: The patient has a normal affect.  STUDIES:   I have reviewed his x-rays and MRI revealing his anatomy.  ASSESSMENT and PLAN   Degenerative back disease: I discussed proceeding with anterior exposure for L5-S1 instrumentation.  I discussed a left lower quadrant transverse incision.  We discussed the risk of wound infection and hernia.  We discussed the risk of injury to the iliac artery or vein as well as the ureter.  We also discussed retrograde ejaculation.  All of his questions were answered.  He is eager to proceed with surgery.   Leia Alf, MD, FACS Vascular and Vein Specialists of  [facility] [phone] Pager [phone]

## 2019-11-01 NOTE — H&P (Signed)
Subjective:   Phillip Chen is a very pleasant 44 year old gentleman is had chronic debilitating back pain for several years now. Pain does occasionally radiate into lateral hip/buttock. He recently completed a course of physical therapy and prior to that chiropractic treatment that unfortunately provided no significant improvement in pain. His primary complaint is loss in quality-of-life due to significant back pain. He would like to move forward with surgical intervention.  Patient Active Problem List   Diagnosis Date Noted  . Gastroesophageal reflux disease without esophagitis 07/06/2019  . Abnormal finding on CT scan 07/06/2019  . Adenomatous polyp of descending colon 06/11/2019   Past Medical History:  Diagnosis Date  . Asthma   . Chronic back pain   . GERD (gastroesophageal reflux disease)   . HTN (hypertension)   . Sleep apnea    CPAP    Past Surgical History:  Procedure Laterality Date  . COLONOSCOPY  05/26/2019   Dr. Rocco Serene; 2 hyperplastic polyp (0.5 cm and 0.4 cm), 4 fragments of tubular adenoma (0.3-0.6 cm), diverticulosis.  Recommended repeat colonoscopy in 3 years.  . ESOPHAGOGASTRODUODENOSCOPY  07/12/2019   Dr. Rocco Serene; moderate acute gastritis, duodenitis, fundic gland polyp with benign pathology.  CLOtest negative.  Noted bile in his stomach which was seen refluxing into the esophagus and up to the level of his vocal cords.  Marland Kitchen SHOULDER SURGERY      Current Outpatient Medications  Medication Sig Dispense Refill Last Dose  . albuterol (VENTOLIN HFA) 108 (90 Base) MCG/ACT inhaler Inhale into the lungs every 6 (six) hours as needed for wheezing or shortness of breath.     . escitalopram (LEXAPRO) 10 MG tablet Take 10 mg by mouth daily.     . Fluticasone-Salmeterol (ADVAIR) 100-50 MCG/DOSE AEPB Inhale 1 puff into the lungs daily.     Marland Kitchen lisinopril-hydrochlorothiazide (ZESTORETIC) 10-12.5 MG tablet Take 1 tablet by mouth daily.      Marland Kitchen omeprazole (PRILOSEC) 20 MG capsule Take  1 capsule (20 mg total) by mouth 2 (two) times daily before a meal. (Patient taking differently: Take 20 mg by mouth in the morning and at bedtime. ) 180 capsule 3   . traMADol (ULTRAM) 50 MG tablet Take 50-100 mg by mouth at bedtime.       No current facility-administered medications for this visit.   Allergies  Allergen Reactions  . Penicillins     Childhood allergy  . Sulfa Antibiotics     Childhood allergy     Social History   Tobacco Use  . Smoking status: Former Smoker    Types: Cigarettes  . Smokeless tobacco: Never Used  Substance Use Topics  . Alcohol use: Yes    Comment: rare-once a month    Family History  Problem Relation Age of Onset  . Diverticulitis Father   . Colon cancer Maternal Uncle     Review of Systems As stated in HPI  Objective:   Vitals: Ht: 5 ft 9.5 in 11/01/2019 02:42 pm Wt: 246 lbs 11/01/2019 02:46 pm BMI: 35.8 11/01/2019 02:46 pm BP: 129/86 11/01/2019 02:46 pm Pulse: 86 bpm 11/01/2019 02:46 pm  Clinical exam: Elster is a pleasant individual, who appears younger than their stated age. He is alert and orientated 3. No shortness of breath, chest pain.   Heart: Regular rate and rhythm, no rubs, murmurs, or gallops  Lungs: Clear auscultation bilaterally  Abdomen is soft and non-tender, negative loss of bowel and bladder control, no rebound tenderness. Bowel sounds 4  Negative: skin lesions abrasions contusions  Peripheral pulses: 2+ dorsalis pedis/posterior tibialis pulses bilaterally. Compartment soft and nontender.  Gait pattern: Normal. Patient developed significant back pain with static standing.  Assistive devices: None  Neuro: 5/5 motor strength bilaterally in the lower extremity. Normal sensation light touch in the lower extremity. Negative straight leg raise test. Negative Babinski test, no clonus, 2+ deep tendon reflexes at the knee and Achilles.  Musculoskeletal: Significant low back pain with static standing in extension of  the spine. Positive improvement in pain with forward flexion. No SI joint pain. No significant hip, knee, ankle pain with isolated joint range of motion.  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 No significant collapse of the adjacent segments. Positive facet arthrosis at L5-S1 is also noted.  Lumbar MRI: completed on 09/01/19 was reviewed with the patient. It was completed at [facility]; I have independently reviewed the images as well as the radiology report. No fracture or abnormal marrow signal changes noted. 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 No significant canal or foraminal stenosis T12-L4 4. Mild disc bulge at L4-5 but no significant foraminal or central stenosis. Patient noted to have a grade 1 anterolisthesis. Should be noted that the MRI is done in the supine position.  Assessment:   Phillip Chen is a very pleasant 44 year old gentleman is had chronic debilitating back pain for several years now. He recently completed a course of physical therapy and prior to that chiropractic treatment that unfortunately provided no significant improvement in pain. His primary complaint is loss in quality-of-life due to significant back pain. Based on his imaging studies he does have a grade 2 spondylolisthesis which is his most likely source of pain.   Plan:   We have discussed various treatment options including surgery versus injection therapy. I have gone through the benefits of injection therapy and is indicated that given the fact that he has a structural abnormality, and he has been in pain for many years he would like to move forward to surgery. At this point I think it is reasonable having tried and failed conservative care for greater than 6 months.      Surgical option would be an anterior lumbar interbody fusion at L5-S1 with posterior supplemental pedicle screw fixation. I have gone over the risks and benefits of each of these surgeries and all of  his and his wife's questions were addressed. We will plan on getting preoperative medical clearance from both his primary care physician as well as the vascular surgeon and move forward with surgery in a timely fashion.      Risks and benefits of surgery were discussed with the patient. These include: Infection, bleeding, death, stroke, paralysis, ongoing or worse pain, need for additional surgery, nonunion, leak of spinal fluid, adjacent segment degeneration requiring additional fusion surgery, need for posterior decompression and/or fusion. Bleeding from major vessels, and blood clots (deep venous thrombosis)requiring additional treatment, loss in bowel and bladder control, injury to bladder, ureters, and other major abdominal organs.   Additional risk for male patients: Retrograde ejaculation, and therefore infertility.      Risks and benefits of spinal fusion: Infection, bleeding, death, stroke, paralysis, ongoing or worse pain, need for additional surgery, nonunion, leak of spinal fluid, adjacent segment degeneration requiring additional fusion surgery, Injury to abdominal vessels that can require anterior surgery to stop bleeding. Malposition of the cage and/or pedicle screws that could require additional surgery. Loss of bowel and bladder control. Postoperative hematoma causing neurologic compression that could require urgent  or emergent re-operation.   We have obtained preoperative medical clearance from the patient's primary care provider.  Patient is not on any blood thinners, not using any anti-inflammatory medications or aspirin.  No vitamins or supplements.  I did advise him not to use any anti-inflammatory medications, aspirin, or anti-inflammatory medications leading up to surgery and he did express understanding of this.  Patient saw vascular surgery this morning.  Per patient, no contraindications to the anterior approach.  We will review the note from vascular once we receive  it.  We have also discussed the post-operative recovery period to include: bathing/showering restrictions,  wound healing, activity (and driving) restrictions, medications/pain mangement.  We have also discussed post-operative redflags to include: signs and symptoms of postoperative infection, DVT/PE. Patient was given a paper prescription for an LSO brace to take to [facility] per his insurance.  Patient has his preop appointment scheduled with Zacarias Pontes for Monday.  All of patient's questions were invited and answered  Follow-up: 2 weeks postoperatively

## 2019-11-05 NOTE — Progress Notes (Signed)
[id number] [place], [place] [address] [place] [address] Phone: [phone] Fax: [phone]      Your procedure is scheduled on November 10, 2019.  Report to [facility] Main Entrance "A" at 6:30 A.M., and check in at the Admitting office.  Call this number if you have problems the morning of surgery:  [phone]  Call [phone] if you have any questions prior to your surgery date Monday-Friday 8am-4pm    Remember:  Do not eat or drink after midnight the night before your surgery    Take these medicines the morning of surgery with A SIP OF WATER: escitalopram (LEXAPRO) Fluticasone-Salmeterol (ADVAIR) omeprazole (PRILOSEC) albuterol (VENTOLIN HFA) - inhaler as needed  As of today, STOP taking any Aspirin (unless otherwise instructed by your surgeon) and Aspirin containing products, Aleve, Naproxen, Ibuprofen, Motrin, Advil, Goody's, BC's, all herbal medications, fish oil, and all vitamins.                      Do not wear jewelry            Do not wear lotions, powders, colognes, or deodorant.            Do not shave 48 hours prior to surgery.  Men may shave face and neck.            Do not bring valuables to the hospital.            [facility] is not responsible for any belongings or valuables.  Do NOT Smoke (Tobacco/Vapping) or drink Alcohol 24 hours prior to your procedure If you use a CPAP at night, you may bring all equipment for your overnight stay.   Contacts, glasses, dentures or bridgework may not be worn into surgery.      For patients admitted to the hospital, discharge time will be determined by your treatment team.   Patients discharged the day of surgery will not be allowed to drive home, and someone needs to stay with them for 24 hours.    Special instructions:   [facility]- Preparing For Surgery  Before surgery, you can play an important role. Because skin is not sterile, your skin  needs to be as free of germs as possible. You can reduce the number of germs on your skin by washing with CHG (chlorahexidine gluconate) Soap before surgery.  CHG is an antiseptic cleaner which kills germs and bonds with the skin to continue killing germs even after washing.    Oral Hygiene is also important to reduce your risk of infection.  Remember - BRUSH YOUR TEETH THE MORNING OF SURGERY WITH YOUR REGULAR TOOTHPASTE  Please do not use if you have an allergy to CHG or antibacterial soaps. If your skin becomes reddened/irritated stop using the CHG.  Do not shave (including legs and underarms) for at least 48 hours prior to first CHG shower. It is OK to shave your face.  Please follow these instructions carefully.   1. Shower the NIGHT BEFORE SURGERY and the MORNING OF SURGERY with CHG Soap.   2. If you chose to wash your hair, wash your hair first as usual with your normal shampoo.  3. After you shampoo, rinse your hair and body thoroughly to remove the shampoo.  4. Use CHG as you would any other liquid soap. You can apply CHG directly to the skin and wash gently with a scrungie  or a clean washcloth.   5. Apply the CHG Soap to your body ONLY FROM THE NECK DOWN.  Do not use on open wounds or open sores. Avoid contact with your eyes, ears, mouth and genitals (private parts). Wash Face and genitals (private parts)  with your normal soap.   6. Wash thoroughly, paying special attention to the area where your surgery will be performed.  7. Thoroughly rinse your body with warm water from the neck down.  8. DO NOT shower/wash with your normal soap after using and rinsing off the CHG Soap.  9. Pat yourself dry with a CLEAN TOWEL.  10. Wear CLEAN PAJAMAS to bed the night before surgery, wear comfortable clothes the morning of surgery  11. Place CLEAN SHEETS on your bed the night of your first shower and DO NOT SLEEP WITH PETS.   Day of Surgery:   Do not apply any deodorants/lotions.   Please wear clean clothes to the hospital/surgery center.   Remember to brush your teeth WITH YOUR REGULAR TOOTHPASTE.   Please read over the following fact sheets that you were given.

## 2019-11-08 ENCOUNTER — Encounter (HOSPITAL_COMMUNITY)
Admission: RE | Admit: 2019-11-08 | Discharge: 2019-11-08 | Disposition: A | Discharge status: Home / Self Care | Payer: BC Managed Care – PPO | Source: Ambulatory Visit | Attending: Orthopedic Surgery | Admitting: Orthopedic Surgery

## 2019-11-08 ENCOUNTER — Other Ambulatory Visit: Payer: Self-pay

## 2019-11-08 ENCOUNTER — Encounter (HOSPITAL_COMMUNITY): Payer: Self-pay

## 2019-11-08 ENCOUNTER — Other Ambulatory Visit (HOSPITAL_COMMUNITY)
Admission: RE | Admit: 2019-11-08 | Discharge: 2019-11-08 | Disposition: A | Discharge status: Home / Self Care | Payer: BC Managed Care – PPO | Source: Ambulatory Visit | Attending: Orthopedic Surgery | Admitting: Orthopedic Surgery

## 2019-11-08 ENCOUNTER — Ambulatory Visit (HOSPITAL_COMMUNITY)
Admission: RE | Admit: 2019-11-08 | Discharge: 2019-11-08 | Disposition: A | Discharge status: Home / Self Care | Payer: BC Managed Care – PPO | Source: Ambulatory Visit | Attending: Orthopedic Surgery | Admitting: Orthopedic Surgery

## 2019-11-08 DIAGNOSIS — Z01810 Encounter for preprocedural cardiovascular examination: Secondary | ICD-10-CM | POA: Diagnosis not present

## 2019-11-08 DIAGNOSIS — Z87891 Personal history of nicotine dependence: Secondary | ICD-10-CM | POA: Diagnosis not present

## 2019-11-08 DIAGNOSIS — Z01818 Encounter for other preprocedural examination: Secondary | ICD-10-CM | POA: Insufficient documentation

## 2019-11-08 DIAGNOSIS — M5410 Radiculopathy, site unspecified: Secondary | ICD-10-CM | POA: Diagnosis not present

## 2019-11-08 DIAGNOSIS — G4730 Sleep apnea, unspecified: Secondary | ICD-10-CM | POA: Diagnosis not present

## 2019-11-08 DIAGNOSIS — J45909 Unspecified asthma, uncomplicated: Secondary | ICD-10-CM | POA: Diagnosis not present

## 2019-11-08 DIAGNOSIS — M4326 Fusion of spine, lumbar region: Secondary | ICD-10-CM | POA: Diagnosis not present

## 2019-11-08 DIAGNOSIS — Z981 Arthrodesis status: Secondary | ICD-10-CM | POA: Diagnosis not present

## 2019-11-08 DIAGNOSIS — Z20822 Contact with and (suspected) exposure to covid-19: Secondary | ICD-10-CM | POA: Diagnosis not present

## 2019-11-08 DIAGNOSIS — M5417 Radiculopathy, lumbosacral region: Secondary | ICD-10-CM | POA: Diagnosis not present

## 2019-11-08 DIAGNOSIS — G8929 Other chronic pain: Secondary | ICD-10-CM | POA: Diagnosis not present

## 2019-11-08 DIAGNOSIS — K219 Gastro-esophageal reflux disease without esophagitis: Secondary | ICD-10-CM | POA: Diagnosis not present

## 2019-11-08 DIAGNOSIS — I10 Essential (primary) hypertension: Secondary | ICD-10-CM | POA: Diagnosis not present

## 2019-11-08 DIAGNOSIS — M4317 Spondylolisthesis, lumbosacral region: Secondary | ICD-10-CM | POA: Diagnosis not present

## 2019-11-08 DIAGNOSIS — G8918 Other acute postprocedural pain: Secondary | ICD-10-CM | POA: Diagnosis not present

## 2019-11-08 DIAGNOSIS — Z79891 Long term (current) use of opiate analgesic: Secondary | ICD-10-CM | POA: Diagnosis not present

## 2019-11-08 DIAGNOSIS — Z79899 Other long term (current) drug therapy: Secondary | ICD-10-CM | POA: Diagnosis not present

## 2019-11-08 DIAGNOSIS — Z800 Family history of malignant neoplasm of digestive organs: Secondary | ICD-10-CM | POA: Diagnosis not present

## 2019-11-08 HISTORY — DX: Anxiety disorder, unspecified: F41.9

## 2019-11-08 LAB — CBC
HCT: 47.5 % (ref 39.0–52.0)
Hemoglobin: 16 g/dL (ref 13.0–17.0)
MCH: 30.6 pg (ref 26.0–34.0)
MCHC: 33.7 g/dL (ref 30.0–36.0)
MCV: 90.8 fL (ref 80.0–100.0)
Platelets: 249 10*3/uL (ref 150–400)
RBC: 5.23 MIL/uL (ref 4.22–5.81)
RDW: 11.9 % (ref 11.5–15.5)
WBC: 7.6 10*3/uL (ref 4.0–10.5)
nRBC: 0 % (ref 0.0–0.2)

## 2019-11-08 LAB — BASIC METABOLIC PANEL
Anion gap: 8 (ref 5–15)
BUN: 12 mg/dL (ref 6–20)
CO2: 24 mmol/L (ref 22–32)
Calcium: 9.2 mg/dL (ref 8.9–10.3)
Chloride: 108 mmol/L (ref 98–111)
Creatinine, Ser: 0.77 mg/dL (ref 0.61–1.24)
GFR calc Af Amer: >60 mL/min (ref >60)
GFR calc non Af Amer: >60 mL/min (ref >60)
Glucose, Bld: 110 mg/dL — ABNORMAL HIGH (ref 70–99)
Potassium: 4.1 mmol/L (ref 3.5–5.1)
Sodium: 140 mmol/L (ref 135–145)

## 2019-11-08 LAB — URINALYSIS, ROUTINE W REFLEX MICROSCOPIC
Bilirubin Urine: NEGATIVE
Glucose, UA: NEGATIVE mg/dL
Hgb urine dipstick: NEGATIVE
Ketones, ur: NEGATIVE mg/dL
Leukocytes,Ua: NEGATIVE
Nitrite: NEGATIVE
Protein, ur: NEGATIVE mg/dL
Specific Gravity, Urine: 1.016 (ref 1.005–1.030)
pH: 5 (ref 5.0–8.0)

## 2019-11-08 LAB — PROTIME-INR
INR: 1.1 (ref 0.8–1.2)
Prothrombin Time: 13.4 seconds (ref 11.4–15.2)

## 2019-11-08 LAB — TYPE AND SCREEN
ABO/RH(D): O POS
Antibody Screen: NEGATIVE

## 2019-11-08 LAB — ABO/RH: ABO/RH(D): O POS

## 2019-11-08 LAB — SURGICAL PCR SCREEN
MRSA, PCR: NEGATIVE
Staphylococcus aureus: POSITIVE — AB

## 2019-11-08 LAB — APTT: aPTT: 30 seconds (ref 24–36)

## 2019-11-08 LAB — SARS CORONAVIRUS 2 (TAT 6-24 HRS): SARS Coronavirus 2: NEGATIVE

## 2019-11-08 NOTE — Progress Notes (Signed)
PCP - Rory Percy, MD Cardiologist - Denies  PPM/ICD - Denies  Chest x-ray - 11/08/19 EKG - 11/08/19 Stress Test - Denies ECHO - Denies Cardiac Cath - Denies  Sleep Study - Yes CPAP - Yes  Patient denies being a diabetic.  Blood Thinner Instructions: N/A Aspirin Instructions: N/A  ERAS Protcol - No PRE-SURGERY Ensure or G2- N/A  COVID TEST- 11/08/19   Anesthesia review: Yes, per MD order.  Patient denies shortness of breath, fever, cough and chest pain at PAT appointment   All instructions explained to the patient, with a verbal understanding of the material. Patient agrees to go over the instructions while at home for a better understanding. Patient also instructed to self quarantine after being tested for COVID-19. The opportunity to ask questions was provided.

## 2019-11-09 MED ORDER — VANCOMYCIN HCL 1500 MG/300ML IV SOLN
1500.0000 mg | INTRAVENOUS | Status: AC
  Administered 2019-11-10: 1500 mg via INTRAVENOUS
  Filled 2019-11-09: qty 300

## 2019-11-09 NOTE — Progress Notes (Signed)
Anesthesia Chart Review:  Follows with gastroenterology for history of GERD and acute gastritis, duodenitis noted on EGD 06/17/2019.  Patient last seen 08/19/2019, per note patient's symptoms improved on twice daily Prilosec.  No changes made to therapy at that time.  History of asthma followed by PCP, uses nebs as needed.  Clearance from PCP Dr. Rory Percy dated 09/22/2019, copy on chart.  Clearance states, "Neb Rx for asthma."  OSA on CPAP.  Preop labs reviewed, WNL.  EKG 11/08/2019: NSR.  Rate 75.  CHEST - 2 VIEW 11/08/2019:  COMPARISON:  None.  FINDINGS: Lungs clear. Heart size normal. No pneumothorax or pleural fluid. No acute or focal bony abnormality.  IMPRESSION: No acute disease.   Wynonia Musty [facility] Short Stay Center/Anesthesiology Phone [phone] 11/09/2019 10:20 AM

## 2019-11-09 NOTE — Anesthesia Preprocedure Evaluation (Addendum)
Anesthesia Evaluation  Patient identified by MRN, date of birth, ID band Patient awake    Reviewed: Allergy & Precautions, NPO status , Patient's Chart, lab work & pertinent test results  History of Anesthesia Complications Negative for: history of anesthetic complications  Airway Mallampati: I  TM Distance: >3 FB Neck ROM: Full    Dental no notable dental hx.    Pulmonary asthma , sleep apnea and Continuous Positive Airway Pressure Ventilation , former smoker,    Pulmonary exam normal        Cardiovascular hypertension, Pt. on medications Normal cardiovascular exam     Neuro/Psych Anxiety negative neurological ROS     GI/Hepatic Neg liver ROS, GERD  Medicated and Controlled,  Endo/Other  negative endocrine ROS  Renal/GU negative Renal ROS  negative genitourinary   Musculoskeletal negative musculoskeletal ROS (+)   Abdominal   Peds  Hematology negative hematology ROS (+)   Anesthesia Other Findings Day of surgery medications reviewed with patient.  Reproductive/Obstetrics negative OB ROS                           Anesthesia Physical Anesthesia Plan  ASA: II  Anesthesia Plan: General   Post-op Pain Management: GA combined w/ Regional for post-op pain   Induction: Intravenous  PONV Risk Score and Plan: 3 and Treatment may vary due to age or medical condition, Ondansetron, Dexamethasone, Midazolam and Propofol infusion  Airway Management Planned: Oral ETT  Additional Equipment: Arterial line  Intra-op Plan:   Post-operative Plan: Extubation in OR  Informed Consent: I have reviewed the patients History and Physical, chart, labs and discussed the procedure including the risks, benefits and alternatives for the proposed anesthesia with the patient or authorized representative who has indicated his/her understanding and acceptance.     Dental advisory given  Plan Discussed with:  CRNA  Anesthesia Plan Comments: (PAT note by Karoline Caldwell, PA-C: Follows with gastroenterology for history of GERD and acute gastritis, duodenitis noted on EGD 06/17/2019.  Patient last seen 08/19/2019, per note patient's symptoms improved on twice daily Prilosec.  No changes made to therapy at that time.  History of asthma followed by PCP, uses nebs as needed.  Clearance from PCP Dr. Rory Percy dated 09/22/2019, copy on chart.  Clearance states, "Neb Rx for asthma."  OSA on CPAP.  Preop labs reviewed, WNL.  EKG 11/08/2019: NSR.  Rate 75.  CHEST - 2 VIEW 11/08/2019:  COMPARISON:  None.  FINDINGS: Lungs clear. Heart size normal. No pneumothorax or pleural fluid. No acute or focal bony abnormality.  IMPRESSION: No acute disease.)      Anesthesia Quick Evaluation

## 2019-11-10 ENCOUNTER — Inpatient Hospital Stay (HOSPITAL_COMMUNITY): Payer: BC Managed Care – PPO

## 2019-11-10 ENCOUNTER — Inpatient Hospital Stay (HOSPITAL_COMMUNITY)
Admission: RE | Admit: 2019-11-10 | Discharge: 2019-11-11 | DRG: 455 | Disposition: A | Discharge status: Home / Self Care | Payer: BC Managed Care – PPO | Attending: Orthopedic Surgery | Admitting: Orthopedic Surgery

## 2019-11-10 ENCOUNTER — Inpatient Hospital Stay (HOSPITAL_COMMUNITY): Payer: BC Managed Care – PPO | Admitting: Physician Assistant

## 2019-11-10 ENCOUNTER — Encounter (HOSPITAL_COMMUNITY)
Admission: RE | Disposition: A | Discharge status: Home / Self Care | Payer: Self-pay | Source: Home / Self Care | Attending: Orthopedic Surgery

## 2019-11-10 ENCOUNTER — Inpatient Hospital Stay (HOSPITAL_COMMUNITY): Payer: BC Managed Care – PPO | Admitting: Anesthesiology

## 2019-11-10 ENCOUNTER — Encounter (HOSPITAL_COMMUNITY): Payer: Self-pay | Admitting: Orthopedic Surgery

## 2019-11-10 ENCOUNTER — Other Ambulatory Visit: Payer: Self-pay

## 2019-11-10 DIAGNOSIS — Z419 Encounter for procedure for purposes other than remedying health state, unspecified: Secondary | ICD-10-CM

## 2019-11-10 DIAGNOSIS — Z79891 Long term (current) use of opiate analgesic: Secondary | ICD-10-CM | POA: Diagnosis not present

## 2019-11-10 DIAGNOSIS — Z20822 Contact with and (suspected) exposure to covid-19: Secondary | ICD-10-CM | POA: Diagnosis present

## 2019-11-10 DIAGNOSIS — M5410 Radiculopathy, site unspecified: Secondary | ICD-10-CM | POA: Diagnosis not present

## 2019-11-10 DIAGNOSIS — Z800 Family history of malignant neoplasm of digestive organs: Secondary | ICD-10-CM

## 2019-11-10 DIAGNOSIS — G8929 Other chronic pain: Secondary | ICD-10-CM | POA: Diagnosis not present

## 2019-11-10 DIAGNOSIS — M4317 Spondylolisthesis, lumbosacral region: Secondary | ICD-10-CM | POA: Diagnosis not present

## 2019-11-10 DIAGNOSIS — Z79899 Other long term (current) drug therapy: Secondary | ICD-10-CM | POA: Diagnosis not present

## 2019-11-10 DIAGNOSIS — G4730 Sleep apnea, unspecified: Secondary | ICD-10-CM | POA: Diagnosis not present

## 2019-11-10 DIAGNOSIS — Z01810 Encounter for preprocedural cardiovascular examination: Secondary | ICD-10-CM | POA: Diagnosis not present

## 2019-11-10 DIAGNOSIS — K219 Gastro-esophageal reflux disease without esophagitis: Secondary | ICD-10-CM | POA: Diagnosis not present

## 2019-11-10 DIAGNOSIS — Z981 Arthrodesis status: Secondary | ICD-10-CM | POA: Diagnosis not present

## 2019-11-10 DIAGNOSIS — M4326 Fusion of spine, lumbar region: Secondary | ICD-10-CM | POA: Diagnosis not present

## 2019-11-10 DIAGNOSIS — J45909 Unspecified asthma, uncomplicated: Secondary | ICD-10-CM | POA: Diagnosis not present

## 2019-11-10 DIAGNOSIS — Z87891 Personal history of nicotine dependence: Secondary | ICD-10-CM | POA: Diagnosis not present

## 2019-11-10 DIAGNOSIS — I10 Essential (primary) hypertension: Secondary | ICD-10-CM | POA: Diagnosis not present

## 2019-11-10 DIAGNOSIS — M5417 Radiculopathy, lumbosacral region: Secondary | ICD-10-CM | POA: Diagnosis not present

## 2019-11-10 HISTORY — PX: ABDOMINAL EXPOSURE: SHX5708

## 2019-11-10 HISTORY — PX: ANTERIOR LUMBAR FUSION: SHX1170

## 2019-11-10 SURGERY — ANTERIOR LUMBAR FUSION 1 LEVEL
Anesthesia: General | Site: Spine Lumbar

## 2019-11-10 MED ORDER — FENTANYL CITRATE (PF) 250 MCG/5ML IJ SOLN
INTRAMUSCULAR | Status: AC
  Filled 2019-11-10: qty 5

## 2019-11-10 MED ORDER — HYDROCHLOROTHIAZIDE 12.5 MG PO CAPS
12.5000 mg | ORAL_CAPSULE | Freq: Every day | ORAL | Status: DC
  Administered 2019-11-10 – 2019-11-11 (×2): 12.5 mg via ORAL
  Filled 2019-11-10 (×2): qty 1

## 2019-11-10 MED ORDER — LACTATED RINGERS IV SOLN
INTRAVENOUS | Status: DC | PRN
Start: 2019-11-10 — End: 2019-11-10

## 2019-11-10 MED ORDER — VANCOMYCIN HCL IN DEXTROSE 1-5 GM/200ML-% IV SOLN
1000.0000 mg | Freq: Once | INTRAVENOUS | Status: AC
  Administered 2019-11-10: 1000 mg via INTRAVENOUS
  Filled 2019-11-10: qty 200

## 2019-11-10 MED ORDER — BUPIVACAINE-EPINEPHRINE (PF) 0.5% -1:200000 IJ SOLN
INTRAMUSCULAR | Status: DC | PRN
  Administered 2019-11-10: 20 mL via PERINEURAL

## 2019-11-10 MED ORDER — ACETAMINOPHEN 500 MG PO TABS
ORAL_TABLET | ORAL | Status: AC
  Administered 2019-11-10: 1000 mg via ORAL
  Filled 2019-11-10: qty 1

## 2019-11-10 MED ORDER — ENOXAPARIN SODIUM 40 MG/0.4ML ~~LOC~~ SOLN
40.0000 mg | SUBCUTANEOUS | Status: DC
  Administered 2019-11-11: 40 mg via SUBCUTANEOUS
  Filled 2019-11-10: qty 0.4

## 2019-11-10 MED ORDER — ACETAMINOPHEN 10 MG/ML IV SOLN: 1000.0000 mg | Freq: Once | INTRAVENOUS | Status: DC | PRN

## 2019-11-10 MED ORDER — TRANEXAMIC ACID-NACL 1000-0.7 MG/100ML-% IV SOLN
INTRAVENOUS | Status: DC | PRN
Start: 2019-11-10 — End: 2019-11-10
  Administered 2019-11-10: 1000 mg via INTRAVENOUS

## 2019-11-10 MED ORDER — LACTATED RINGERS IV SOLN: INTRAVENOUS | Status: DC | PRN

## 2019-11-10 MED ORDER — ESCITALOPRAM OXALATE 10 MG PO TABS
10.0000 mg | ORAL_TABLET | Freq: Every day | ORAL | Status: DC
  Administered 2019-11-11: 10 mg via ORAL
  Filled 2019-11-10: qty 1

## 2019-11-10 MED ORDER — ENOXAPARIN SODIUM 40 MG/0.4ML ~~LOC~~ SOLN
40.0000 mg | SUBCUTANEOUS | 0 refills | Status: DC
Start: 2019-11-10 — End: 2020-01-13

## 2019-11-10 MED ORDER — LISINOPRIL 10 MG PO TABS
10.0000 mg | ORAL_TABLET | Freq: Every day | ORAL | Status: DC
  Administered 2019-11-10 – 2019-11-11 (×2): 10 mg via ORAL
  Filled 2019-11-10 (×2): qty 1

## 2019-11-10 MED ORDER — POLYETHYLENE GLYCOL 3350 17 G PO PACK: 17.0000 g | PACK | Freq: Every day | ORAL | Status: DC | PRN

## 2019-11-10 MED ORDER — THROMBIN 5000 UNITS EX SOLR
CUTANEOUS | Status: DC | PRN
  Administered 2019-11-10: 5000 [IU] via TOPICAL

## 2019-11-10 MED ORDER — DEXMEDETOMIDINE HCL 200 MCG/2ML IV SOLN
INTRAVENOUS | Status: DC | PRN
  Administered 2019-11-10 (×2): 20 ug via INTRAVENOUS

## 2019-11-10 MED ORDER — KETAMINE HCL 10 MG/ML IJ SOLN
INTRAMUSCULAR | Status: DC | PRN
  Administered 2019-11-10 (×3): 10 mg via INTRAVENOUS
  Administered 2019-11-10: 20 mg via INTRAVENOUS
  Administered 2019-11-10: 50 mg via INTRAVENOUS

## 2019-11-10 MED ORDER — ACETAMINOPHEN 650 MG RE SUPP: 650.0000 mg | RECTAL | Status: DC | PRN

## 2019-11-10 MED ORDER — METHOCARBAMOL 500 MG PO TABS
500.0000 mg | ORAL_TABLET | Freq: Four times a day (QID) | ORAL | Status: DC | PRN
  Administered 2019-11-10 – 2019-11-11 (×4): 500 mg via ORAL
  Filled 2019-11-10 (×4): qty 1

## 2019-11-10 MED ORDER — CHLORHEXIDINE GLUCONATE 4 % EX LIQD: 60.0000 mL | Freq: Once | CUTANEOUS | Status: DC

## 2019-11-10 MED ORDER — DOCUSATE SODIUM 100 MG PO CAPS
100.0000 mg | ORAL_CAPSULE | Freq: Two times a day (BID) | ORAL | Status: DC
  Administered 2019-11-10 – 2019-11-11 (×2): 100 mg via ORAL
  Filled 2019-11-10 (×2): qty 1

## 2019-11-10 MED ORDER — HYDROMORPHONE HCL 1 MG/ML IJ SOLN
0.2500 mg | INTRAMUSCULAR | Status: DC | PRN
  Administered 2019-11-10 (×2): 0.5 mg via INTRAVENOUS

## 2019-11-10 MED ORDER — PHENOL 1.4 % MT LIQD: 1.0000 | OROMUCOSAL | Status: DC | PRN

## 2019-11-10 MED ORDER — SODIUM CHLORIDE 0.9% FLUSH: 3.0000 mL | INTRAVENOUS | Status: DC | PRN

## 2019-11-10 MED ORDER — SODIUM CHLORIDE 0.9% FLUSH: 3.0000 mL | Freq: Two times a day (BID) | INTRAVENOUS | Status: DC

## 2019-11-10 MED ORDER — PROMETHAZINE HCL 25 MG/ML IJ SOLN: 6.2500 mg | INTRAMUSCULAR | Status: DC | PRN

## 2019-11-10 MED ORDER — TRANEXAMIC ACID-NACL 1000-0.7 MG/100ML-% IV SOLN
INTRAVENOUS | Status: AC
  Filled 2019-11-10: qty 100

## 2019-11-10 MED ORDER — ACETAMINOPHEN 325 MG PO TABS
650.0000 mg | ORAL_TABLET | ORAL | Status: DC | PRN
  Administered 2019-11-10 – 2019-11-11 (×2): 650 mg via ORAL
  Filled 2019-11-10 (×2): qty 2

## 2019-11-10 MED ORDER — KETAMINE HCL 50 MG/5ML IJ SOSY
PREFILLED_SYRINGE | INTRAMUSCULAR | Status: AC
  Filled 2019-11-10: qty 25

## 2019-11-10 MED ORDER — BUPIVACAINE-EPINEPHRINE (PF) 0.25% -1:200000 IJ SOLN
INTRAMUSCULAR | Status: AC
  Filled 2019-11-10: qty 30

## 2019-11-10 MED ORDER — CHLORHEXIDINE GLUCONATE 0.12 % MT SOLN: 15.0000 mL | Freq: Once | OROMUCOSAL | Status: AC

## 2019-11-10 MED ORDER — PROPOFOL 10 MG/ML IV BOLUS
INTRAVENOUS | Status: DC | PRN
  Administered 2019-11-10 (×2): 200 mg via INTRAVENOUS

## 2019-11-10 MED ORDER — ENOXAPARIN (LOVENOX) PATIENT EDUCATION KIT
PACK | Freq: Once | Status: AC
  Filled 2019-11-10: qty 1

## 2019-11-10 MED ORDER — GABAPENTIN 300 MG PO CAPS
300.0000 mg | ORAL_CAPSULE | Freq: Three times a day (TID) | ORAL | Status: DC
  Administered 2019-11-10 – 2019-11-11 (×3): 300 mg via ORAL
  Filled 2019-11-10 (×3): qty 1

## 2019-11-10 MED ORDER — BISACODYL 10 MG RE SUPP: 10.0000 mg | Freq: Every day | RECTAL | Status: DC | PRN

## 2019-11-10 MED ORDER — DEXAMETHASONE SODIUM PHOSPHATE 10 MG/ML IJ SOLN
INTRAMUSCULAR | Status: DC | PRN
  Administered 2019-11-10: 10 mg via INTRAVENOUS

## 2019-11-10 MED ORDER — ALBUTEROL SULFATE (2.5 MG/3ML) 0.083% IN NEBU: 2.5000 mg | INHALATION_SOLUTION | Freq: Four times a day (QID) | RESPIRATORY_TRACT | Status: DC | PRN

## 2019-11-10 MED ORDER — SUGAMMADEX SODIUM 200 MG/2ML IV SOLN
INTRAVENOUS | Status: DC | PRN
  Administered 2019-11-10: 200 mg via INTRAVENOUS

## 2019-11-10 MED ORDER — THROMBIN (RECOMBINANT) 5000 UNITS EX SOLR
CUTANEOUS | Status: AC
  Filled 2019-11-10: qty 5000

## 2019-11-10 MED ORDER — ALBUTEROL SULFATE HFA 108 (90 BASE) MCG/ACT IN AERS: 1.0000 | INHALATION_SPRAY | Freq: Four times a day (QID) | RESPIRATORY_TRACT | Status: DC | PRN

## 2019-11-10 MED ORDER — FLUTICASONE FUROATE-VILANTEROL 100-25 MCG/INH IN AEPB
1.0000 | INHALATION_SPRAY | Freq: Every day | RESPIRATORY_TRACT | Status: DC
  Administered 2019-11-11: 1 via RESPIRATORY_TRACT
  Filled 2019-11-10: qty 28

## 2019-11-10 MED ORDER — OXYCODONE HCL 5 MG PO TABS: 5.0000 mg | ORAL_TABLET | ORAL | Status: DC | PRN

## 2019-11-10 MED ORDER — HYDROMORPHONE HCL 1 MG/ML IJ SOLN
INTRAMUSCULAR | Status: AC
  Filled 2019-11-10: qty 1

## 2019-11-10 MED ORDER — LIDOCAINE HCL (CARDIAC) PF 100 MG/5ML IV SOSY
PREFILLED_SYRINGE | INTRAVENOUS | Status: DC | PRN
  Administered 2019-11-10: 100 mg via INTRAVENOUS

## 2019-11-10 MED ORDER — HEMOSTATIC AGENTS (NO CHARGE) OPTIME
TOPICAL | Status: DC | PRN
  Administered 2019-11-10 (×4): 1 via TOPICAL

## 2019-11-10 MED ORDER — MAGNESIUM CITRATE PO SOLN
1.0000 | Freq: Once | ORAL | Status: AC
  Administered 2019-11-10: 1 via ORAL
  Filled 2019-11-10: qty 296

## 2019-11-10 MED ORDER — FLEET ENEMA 7-19 GM/118ML RE ENEM: 1.0000 | ENEMA | Freq: Once | RECTAL | Status: DC | PRN

## 2019-11-10 MED ORDER — BUPIVACAINE LIPOSOME 1.3 % IJ SUSP
INTRAMUSCULAR | Status: DC | PRN
  Administered 2019-11-10: 10 mL via PERINEURAL

## 2019-11-10 MED ORDER — MENTHOL 3 MG MT LOZG: 1.0000 | LOZENGE | OROMUCOSAL | Status: DC | PRN

## 2019-11-10 MED ORDER — SODIUM CHLORIDE 0.9 % IV SOLN: 250.0000 mL | INTRAVENOUS | Status: DC

## 2019-11-10 MED ORDER — MIDAZOLAM HCL 5 MG/5ML IJ SOLN
INTRAMUSCULAR | Status: DC | PRN
  Administered 2019-11-10: 2 mg via INTRAVENOUS

## 2019-11-10 MED ORDER — PROPOFOL 500 MG/50ML IV EMUL
INTRAVENOUS | Status: DC | PRN
Start: 2019-11-10 — End: 2019-11-10
  Administered 2019-11-10: 75 ug/kg/min via INTRAVENOUS

## 2019-11-10 MED ORDER — PROPOFOL 10 MG/ML IV BOLUS
INTRAVENOUS | Status: AC
  Filled 2019-11-10: qty 20

## 2019-11-10 MED ORDER — FENTANYL CITRATE (PF) 100 MCG/2ML IJ SOLN
INTRAMUSCULAR | Status: DC | PRN
  Administered 2019-11-10: 150 ug via INTRAVENOUS
  Administered 2019-11-10: 100 ug via INTRAVENOUS

## 2019-11-10 MED ORDER — ORAL CARE MOUTH RINSE: 15.0000 mL | Freq: Once | OROMUCOSAL | Status: AC

## 2019-11-10 MED ORDER — METHOCARBAMOL 1000 MG/10ML IJ SOLN
500.0000 mg | Freq: Four times a day (QID) | INTRAVENOUS | Status: DC | PRN
  Filled 2019-11-10: qty 5

## 2019-11-10 MED ORDER — BUPIVACAINE-EPINEPHRINE (PF) 0.25% -1:200000 IJ SOLN
INTRAMUSCULAR | Status: DC | PRN
  Administered 2019-11-10: 20 mL

## 2019-11-10 MED ORDER — MIDAZOLAM HCL 2 MG/2ML IJ SOLN
INTRAMUSCULAR | Status: AC
  Filled 2019-11-10: qty 2

## 2019-11-10 MED ORDER — ONDANSETRON HCL 4 MG PO TABS: 4.0000 mg | ORAL_TABLET | Freq: Four times a day (QID) | ORAL | Status: DC | PRN

## 2019-11-10 MED ORDER — PHENYLEPHRINE HCL-NACL 10-0.9 MG/250ML-% IV SOLN
INTRAVENOUS | Status: DC | PRN
Start: 2019-11-10 — End: 2019-11-10
  Administered 2019-11-10: 40 ug/min via INTRAVENOUS

## 2019-11-10 MED ORDER — ONDANSETRON HCL 4 MG/2ML IJ SOLN: 4.0000 mg | Freq: Four times a day (QID) | INTRAMUSCULAR | Status: DC | PRN

## 2019-11-10 MED ORDER — CHLORHEXIDINE GLUCONATE 0.12 % MT SOLN
OROMUCOSAL | Status: AC
  Administered 2019-11-10: 15 mL via OROMUCOSAL
  Filled 2019-11-10: qty 15

## 2019-11-10 MED ORDER — OXYCODONE-ACETAMINOPHEN 10-325 MG PO TABS: 1.0000 | ORAL_TABLET | Freq: Four times a day (QID) | ORAL | 0 refills | Status: AC | PRN

## 2019-11-10 MED ORDER — LACTATED RINGERS IV SOLN: INTRAVENOUS | Status: DC

## 2019-11-10 MED ORDER — OXYCODONE HCL 5 MG PO TABS
10.0000 mg | ORAL_TABLET | ORAL | Status: DC | PRN
  Administered 2019-11-10 – 2019-11-11 (×7): 10 mg via ORAL
  Filled 2019-11-10 (×7): qty 2

## 2019-11-10 MED ORDER — 0.9 % SODIUM CHLORIDE (POUR BTL) OPTIME
TOPICAL | Status: DC | PRN
  Administered 2019-11-10 (×2): 1000 mL

## 2019-11-10 MED ORDER — ONDANSETRON HCL 4 MG/2ML IJ SOLN
INTRAMUSCULAR | Status: DC | PRN
  Administered 2019-11-10: 4 mg via INTRAVENOUS

## 2019-11-10 MED ORDER — ACETAMINOPHEN 10 MG/ML IV SOLN
INTRAVENOUS | Status: AC
  Filled 2019-11-10: qty 100

## 2019-11-10 MED ORDER — LISINOPRIL-HYDROCHLOROTHIAZIDE 10-12.5 MG PO TABS: 1.0000 | ORAL_TABLET | Freq: Every day | ORAL | Status: DC

## 2019-11-10 MED ORDER — LIDOCAINE 2% (20 MG/ML) 5 ML SYRINGE
INTRAMUSCULAR | Status: AC
  Filled 2019-11-10: qty 5

## 2019-11-10 MED ORDER — ONDANSETRON HCL 4 MG PO TABS: 4.0000 mg | ORAL_TABLET | Freq: Three times a day (TID) | ORAL | 0 refills | Status: DC | PRN

## 2019-11-10 MED ORDER — ACETAMINOPHEN 500 MG PO TABS: 1000.0000 mg | ORAL_TABLET | Freq: Once | ORAL | Status: AC

## 2019-11-10 MED ORDER — ROCURONIUM BROMIDE 100 MG/10ML IV SOLN
INTRAVENOUS | Status: DC | PRN
  Administered 2019-11-10 (×2): 20 mg via INTRAVENOUS
  Administered 2019-11-10: 70 mg via INTRAVENOUS

## 2019-11-10 MED ORDER — METHOCARBAMOL 500 MG PO TABS: 500.0000 mg | ORAL_TABLET | Freq: Three times a day (TID) | ORAL | 0 refills | Status: AC | PRN

## 2019-11-10 MED ORDER — ONDANSETRON HCL 4 MG/2ML IJ SOLN
INTRAMUSCULAR | Status: AC
  Filled 2019-11-10: qty 2

## 2019-11-10 SURGICAL SUPPLY — 103 items
APPLIER CLIP 11 MED OPEN (CLIP) ×4
BLADE CLIPPER SURG (BLADE) ×8 IMPLANT
BLADE SURG 10 STRL SS (BLADE) ×4 IMPLANT
BONE VIVIGEN FORMABLE 5.4CC (Bone Implant) ×4 IMPLANT
CABLE BIPOLOR RESECTION CORD (MISCELLANEOUS) ×8 IMPLANT
CAGE INTERBODY NL 15 7D LG (Cage) ×4 IMPLANT
CATH FOLEY 2WAY SLVR  5CC 16FR (CATHETERS) ×4
CATH FOLEY 2WAY SLVR 5CC 16FR (CATHETERS) ×2 IMPLANT
CLIP APPLIE 11 MED OPEN (CLIP) ×2 IMPLANT
CLIP NEUROVISION LG (CLIP) ×4 IMPLANT
CLIP VESOCCLUDE MED 24/CT (CLIP) ×4 IMPLANT
CLIP VESOCCLUDE SM WIDE 24/CT (CLIP) ×4 IMPLANT
CLOSURE STERI-STRIP 1/2X4 (GAUZE/BANDAGES/DRESSINGS) ×3
CLSR STERI-STRIP ANTIMIC 1/2X4 (GAUZE/BANDAGES/DRESSINGS) ×9 IMPLANT
COVER BACK TABLE 60X90IN (DRAPES) ×4 IMPLANT
COVER SURGICAL LIGHT HANDLE (MISCELLANEOUS) ×8 IMPLANT
COVER WAND RF STERILE (DRAPES) ×8 IMPLANT
DRAPE C-ARM 42X72 X-RAY (DRAPES) ×8 IMPLANT
DRAPE C-ARMOR (DRAPES) ×4 IMPLANT
DRAPE INCISE IOBAN 66X45 STRL (DRAPES) ×4 IMPLANT
DRAPE POUCH INSTRU U-SHP 10X18 (DRAPES) ×4 IMPLANT
DRAPE SURG 17X23 STRL (DRAPES) ×4 IMPLANT
DRAPE U-SHAPE 47X51 STRL (DRAPES) ×4 IMPLANT
DRSG OPSITE POSTOP 4X6 (GAUZE/BANDAGES/DRESSINGS) ×8 IMPLANT
DRSG OPSITE POSTOP 4X8 (GAUZE/BANDAGES/DRESSINGS) ×4 IMPLANT
DURAPREP 26ML APPLICATOR (WOUND CARE) ×8 IMPLANT
ELECT BLADE 4.0 EZ CLEAN MEGAD (MISCELLANEOUS) ×4
ELECT CAUTERY BLADE 6.4 (BLADE) ×4 IMPLANT
ELECT PENCIL ROCKER SW 15FT (MISCELLANEOUS) ×4 IMPLANT
ELECT REM PT RETURN 9FT ADLT (ELECTROSURGICAL) ×4
ELECTRODE BLDE 4.0 EZ CLN MEGD (MISCELLANEOUS) ×2 IMPLANT
ELECTRODE REM PT RTRN 9FT ADLT (ELECTROSURGICAL) ×2 IMPLANT
GAUZE 4X4 16PLY RFD (DISPOSABLE) IMPLANT
GLOVE BIO SURGEON STRL SZ 6.5 (GLOVE) ×3 IMPLANT
GLOVE BIO SURGEON STRL SZ7.5 (GLOVE) IMPLANT
GLOVE BIO SURGEONS STRL SZ 6.5 (GLOVE) ×1
GLOVE BIOGEL PI IND STRL 6.5 (GLOVE) ×2 IMPLANT
GLOVE BIOGEL PI IND STRL 7.5 (GLOVE) ×2 IMPLANT
GLOVE BIOGEL PI IND STRL 8.5 (GLOVE) ×4 IMPLANT
GLOVE BIOGEL PI INDICATOR 6.5 (GLOVE) ×2
GLOVE BIOGEL PI INDICATOR 7.5 (GLOVE) ×2
GLOVE BIOGEL PI INDICATOR 8.5 (GLOVE) ×4
GLOVE SS BIOGEL STRL SZ 8.5 (GLOVE) ×4 IMPLANT
GLOVE SUPERSENSE BIOGEL SZ 8.5 (GLOVE) ×4
GLOVE SURG SS PI 7.5 STRL IVOR (GLOVE) ×4 IMPLANT
GOWN STRL REUS W/ TWL LRG LVL3 (GOWN DISPOSABLE) ×10 IMPLANT
GOWN STRL REUS W/ TWL XL LVL3 (GOWN DISPOSABLE) ×2 IMPLANT
GOWN STRL REUS W/TWL 2XL LVL3 (GOWN DISPOSABLE) ×8 IMPLANT
GOWN STRL REUS W/TWL LRG LVL3 (GOWN DISPOSABLE) ×20
GOWN STRL REUS W/TWL XL LVL3 (GOWN DISPOSABLE) ×4
GUIDEWIRE NITINOL BEVEL TIP (WIRE) ×16 IMPLANT
HEMOSTAT SNOW SURGICEL 2X4 (HEMOSTASIS) IMPLANT
KIT BASIN OR (CUSTOM PROCEDURE TRAY) ×4 IMPLANT
KIT TURNOVER KIT B (KITS) ×8 IMPLANT
MODULE EMG NEEDLE SSEP NVM5 (NEEDLE) ×4 IMPLANT
MODULE NVM5 NEXT GEN EMG (NEEDLE) ×4 IMPLANT
NEEDLE SPNL 18GX3.5 QUINCKE PK (NEEDLE) ×4 IMPLANT
NS IRRIG 1000ML POUR BTL (IV SOLUTION) ×12 IMPLANT
PACK LAMINECTOMY ORTHO (CUSTOM PROCEDURE TRAY) ×4 IMPLANT
PACK UNIVERSAL I (CUSTOM PROCEDURE TRAY) ×8 IMPLANT
PAD ARMBOARD 7.5X6 YLW CONV (MISCELLANEOUS) ×16 IMPLANT
PROBE BALL TIP NVM5 SNG USE (BALLOONS) ×4 IMPLANT
PUTTY BONE DBX 2.5 MIS (Bone Implant) ×4 IMPLANT
ROD RELINE MAS LORD 5.5X45MM (Rod) ×8 IMPLANT
SCREW 6.5X25 (Screw) ×8 IMPLANT
SCREW LOCK RELINE 5.5 TULIP (Screw) ×16 IMPLANT
SCREW RED RELINE 7.5X45MM POLY (Screw) ×8 IMPLANT
SCREW RELINE RED 6.5X45MM POLY (Screw) ×8 IMPLANT
SPONGE INTESTINAL PEANUT (DISPOSABLE) ×12 IMPLANT
SPONGE LAP 18X18 RF (DISPOSABLE) IMPLANT
SPONGE LAP 4X18 RFD (DISPOSABLE) IMPLANT
SPONGE SURGIFOAM ABS GEL 100 (HEMOSTASIS) ×4 IMPLANT
SURGIFLO W/THROMBIN 8M KIT (HEMOSTASIS) ×8 IMPLANT
SUT BONE WAX W31G (SUTURE) ×4 IMPLANT
SUT MNCRL AB 3-0 PS2 27 (SUTURE) ×4 IMPLANT
SUT MNCRL AB 4-0 PS2 18 (SUTURE) ×4 IMPLANT
SUT MON AB 3-0 SH 27 (SUTURE) ×4
SUT MON AB 3-0 SH27 (SUTURE) ×2 IMPLANT
SUT PDS AB 1 CTX 36 (SUTURE) ×8 IMPLANT
SUT PROLENE 4 0 RB 1 (SUTURE) ×16
SUT PROLENE 4-0 RB1 .5 CRCL 36 (SUTURE) ×8 IMPLANT
SUT PROLENE 5 0 C 1 24 (SUTURE) IMPLANT
SUT PROLENE 5 0 CC1 (SUTURE) IMPLANT
SUT PROLENE 6 0 C 1 30 (SUTURE) ×4 IMPLANT
SUT PROLENE 6 0 CC (SUTURE) IMPLANT
SUT SILK 0 TIES 10X30 (SUTURE) ×4 IMPLANT
SUT SILK 2 0 TIES 10X30 (SUTURE) ×8 IMPLANT
SUT SILK 2 0SH CR/8 30 (SUTURE) IMPLANT
SUT SILK 3 0 TIES 10X30 (SUTURE) ×8 IMPLANT
SUT SILK 3 0SH CR/8 30 (SUTURE) IMPLANT
SUT VIC AB 0 CT1 27 (SUTURE) ×4
SUT VIC AB 0 CT1 27XBRD ANBCTR (SUTURE) ×2 IMPLANT
SUT VIC AB 1 CT1 27 (SUTURE) ×8
SUT VIC AB 1 CT1 27XBRD ANBCTR (SUTURE) ×4 IMPLANT
SUT VIC AB 2-0 CT1 18 (SUTURE) ×8 IMPLANT
SUT VIC AB 2-0 CT1 27 (SUTURE) ×4
SUT VIC AB 2-0 CT1 TAPERPNT 27 (SUTURE) ×2 IMPLANT
SUT VIC AB 3-0 SH 27 (SUTURE) ×4
SUT VIC AB 3-0 SH 27X BRD (SUTURE) ×2 IMPLANT
SYR BULB IRRIG 60ML STRL (SYRINGE) ×4 IMPLANT
TOWEL GREEN STERILE (TOWEL DISPOSABLE) ×8 IMPLANT
TOWEL GREEN STERILE FF (TOWEL DISPOSABLE) ×4 IMPLANT
WATER STERILE IRR 1000ML POUR (IV SOLUTION) ×4 IMPLANT

## 2019-11-10 NOTE — Op Note (Signed)
    Patient name: Phillip Chen MRN: [id number] DOB: 10-Aug-1975 Sex: male  11/10/2019 Pre-operative Diagnosis: Degenerative lower back disease Post-operative diagnosis:  Same Surgeon:  Annamarie Major Co-surgeon: Melina Schools Procedure:   Anterior exposure L5-S1 Anesthesia: General Blood Loss: 150 cc Specimens: None  Findings: Atretic lower rectus muscle.  Otherwise, normal anatomy  Indications: Anterior exposure of the L5-S1 disc space was requested.  I discussed the risks and benefits of the procedure with the patient at his preoperative clinic visit.  All questions were answered.  Procedure:  The patient was identified in the holding area and taken to [place]  The patient was then placed supine on the table. general anesthesia was administered.  The patient was prepped and draped in the usual sterile fashion.  A time out was called and antibiotics were administered.  Fluoroscopy was used to determine the appropriate level of skin incision.  A transverse left lower quadrant incision was made beginning at the midline extending laterally.  Cautery was used to divide subcutaneous tissue down to the fascia.  The fascia was then opened with cautery and subfascial flaps were raised.  The patient had an atretic lower rectus abdominis muscle.  I entered the retroperitoneal space lateral to the rectus muscle.  I then identified the iliac artery and began to mobilize the tissue and peritoneal contents medially and cephalad.  The ureter was visualized and protected laterally.  Blunt dissection with a Kitner was used to exposed the right and left lateral side of the spine.  The NuVasive retractor was placed.  160 blades were placed on either side of the spine.  The median sacral vessels were ligated between silk ties.  A 180 blade was placed inferiorly.  At this point, there was adequate exposure.  A spinal needle was placed and fluoroscopy confirmed that we were at the appropriate location.  At this point,  Dr. Rolena Infante came in and performed his portions of the procedure.  Please see his detailed operative note for remaining procedure.   Disposition: To PACU stable.   [name], M.D., [facility] [place] Office: [phone] Pager:  [phone]

## 2019-11-10 NOTE — Anesthesia Postprocedure Evaluation (Signed)
Anesthesia Post Note  Patient: Phillip Chen  Procedure(s) Performed: ANTERIOR LUMBAR FUSION (ALIF) LUMBAR FIVE-SACRAL ONE, POSTERIOR SPINAL FUSION INTERBODY LUMBAR FIVE -SACRAL ONE (N/A Spine Lumbar) ABDOMINAL EXPOSURE (N/A Abdomen)     Patient location during evaluation: PACU Anesthesia Type: General Level of consciousness: awake and alert and oriented Pain management: pain level controlled Vital Signs Assessment: post-procedure vital signs reviewed and stable Respiratory status: spontaneous breathing, nonlabored ventilation and respiratory function stable Cardiovascular status: blood pressure returned to baseline Postop Assessment: no apparent nausea or vomiting Anesthetic complications: no   No complications documented.  Last Vitals:  Vitals:   11/10/19 1545 11/10/19 1600  BP: 125/82 126/71  Pulse: 88 96  Resp: 17 17  Temp:    SpO2: 96% 97%    Last Pain:  Vitals:   11/10/19 1545  TempSrc:   PainSc: [place]

## 2019-11-10 NOTE — Op Note (Signed)
Operative report  Preoperative diagnosis: Isthmic spondylolisthesis L5-S1 with radiculopathy.  Postoperative diagnosis: Same  Operative procedure: Anterior lumbar interbody fusion L5-S1.  Posterior pedicle screw fusion L5-S1.  First Assistant: Cleta Alberts, PA  Vascular/approach surgeon: Dr. Annamarie Major  Complications: None  Estimated blood loss: 200 cc  Implants: Medtronic/Titan anterior intervertebral cage.  15 mm large by 7 degree lordosis.  NuVasive MIS posterior pedicle screw fixation.  L5: 6.5 x 45 mm length screws.  S1: 7.5 x 45 mm length screws.  Neuro monitoring:  Posterior pedicle screw fixation was done with neuro monitoring.  There were no adverse intraoperative events as it relates to SSEPs or free running EMGs.  All 4 pedicle screws were directly stimulated and there was no adverse activity at greater than 40 mA.  Allograft: vivogen/DBX mix  Indications: This is a very pleasant 44 year old gentleman who is been having severe back buttock and radicular leg pain for some time now.  Attempts at conservative management had failed to alleviate his symptoms.  Because of the severity of his pain and the loss in quality of life we elected to move forward with surgery.  All appropriate risks, benefits, and alternatives to surgery were discussed with the patient and consent was obtained.  Operative report: Patient was brought the operative room placed upon the operating room table.  After successful induction of general anesthesia and endotracheal intubation, teds SCDs and a Foley were inserted.  The abdomen was then prepped and draped in a standard fashion.  Timeout was taken to confirm patient procedure and all other important data.  At this point time using fluoroscopy we marked out the midline as well as the incision site.  Once this was completed Dr. Trula Slade performed a standard anterior retroperitoneal approach to the lumbar spine.  Please refer to his dictation for specifics on  the approach.  Once the appropriate retractors were placed a needle was placed into the L5-S1 disc space and a intraoperative fluoroscopy view was taken to confirm that we are at the appropriate level.  At this point time Dr. Trula Slade scrubbed out and I continued forward with the surgery.  An annulotomy was performed with a 10 blade scalpel and I remove the bulk of the disc material with a Cobb elevator, curettes, and a large pituitary rongeurs.  I then continue to work posteriorly with small curved curettes until I was at the posterior annulus.  I then passed my curved curette behind the body of L5 and released the annulus and performed a similar maneuver behind the body of S1.  I confirmed that I was properly positioned using for lateral fluoroscopy.  I then continue to remove all of the cartilaginous endplate until I had bleeding subchondral bone.  Under live fluoroscopy I placed my lamina spreader and then opened and closed to confirm I had parallel endplate distraction.  Once this was confirmed I proceeded with the fixation.  Using a trial rasp I placed a size fourteen 7 degree lordotic implant and a size fifteen 7 degree lordotic implant.  The 15 rasp trial provided the best overall fit and reduction in the spondylolisthesis.  The implant was obtained and packed with the allograft.  I then malleted the cage to the appropriate depth.  I confirmed satisfactory position in the AP and lateral fluoroscopy views.  In order to add stability to this construct I placed a single locking screw through the cage and into the S1 vertebral body.  Is a 25 mm length 6.5 diameter screw.  A second screw was placed through the cage and into the L5 vertebral body.  Both screws obtained excellent purchase.  X-rays at this time demonstrated satisfactory position of the intervertebral cage as well as the 2 locking screws.  At this point the wound was copiously irrigated with normal saline and hemostasis was confirmed.  I then  removed the retractor sequentially confirming that there was no vascular injury.  With all the retractors were removed I then reapproximated the fascia of the rectus abdominis with a running #1 PDS suture.  The wound was then irrigated again and then I closed in a layered fashion with a running 0 Vicryl suture, 2-0 Vicryl suture, and a 3-0 Monocryl for the skin.  An abdominal AP digital x-ray was taken and read by the radiologist per protocol.  This confirmed that there was no surgical instruments left in the wound.  The [facility] spine frame was then secured over the patient and all bony prominences were well-padded the patient was then rotated into the prone position onto the [place] spine frame.  The flat frame was removed and the arms were placed overhead and all bony prominences were well-padded and the back was now prepped and draped in a standard fashion.  Timeout was taken confirming patient procedure and all other important data.  Using AP fluoroscopy identified the lateral portion of the L5 and the S1 pedicles.  I marked them out and then made a small stab incision Jamshidi needle was advanced percutaneously to the lateral aspect of the L5 pedicle.  The Jamshidi needle was then connected to the neuro monitoring and using fluoroscopy and neural stimulation I advanced the Jamshidi needle using the AP plane.  As I neared the medial wall of the pedicle I switched to the lateral view.  I confirmed that the tip of the Jamshidi needle was just beyond the posterior wall the vertebral body.  In addition there was no adverse free running EMG activity.  I then advanced the Jamshidi needle into the vertebral body and then placed the guidepin.  At this point the L5 pedicle was cannulated.  Using this exact same technique I cannulated the S1 pedicle and the contralateral L5 and S1 pedicles.  Once all 4 pedicles were cannulated I took intraoperative AP and lateral fluoroscopy to confirm satisfactory overall positioning  of the guidepins.  I then measured and placed the appropriate size screw over the guidepin and advanced it into the pedicle.  I again directly stimulated the screw as I was inserting it and there was no adverse activity.  All 4 screws were placed over the guidepins and the guidepins were removed.  All 4 screws had excellent purchase.  I then directly stimulated the screw heads and there was no adverse free running EMG activity at greater than 40 mA.  I then measured and placed the appropriate size rod across the pedicle screw construct.  Locking caps were then inserted, and tightened/torqued according manufacture standards.  At this point both rods were torqued and tightened according to manufacture standards.  I remove the inserting tabs and then took my final x-rays.  Both the anterior and posterior constructs were well positioned there was no migration of the intervertebral cage and the pedicle screws were properly positioned.  At this point with the instrumentation complete I irrigated all 4 of the small incisions and closed them in a layered fashion with interrupted #1 Vicryl suture, 2-0 Vicryl suture, and a 3-0 Monocryl.  Steri-Strips and a dry dressing  were applied and the patient was ultimately extubated transfer the PACU without incident.  The end of the case all needle and sponge counts were correct.

## 2019-11-10 NOTE — Progress Notes (Signed)
Orthopedic Tech Progress Note Patient Details:  [name] 04-15-1976 [id number] Patient has back brace Patient ID: Phillip Chen, male   DOB: Jun 07, 1975, 44 y.o.   MRN: [id number]   [name] 11/10/2019, 3:30 PM

## 2019-11-10 NOTE — Progress Notes (Signed)
Pt doesn't have drain in place per RN, therefore, he will only need 1 dose of vanc post op.   Scr 0.77  Vanc 1g IV x1 12hr post op Rx signs off  Onnie Boer, PharmD, [place], AAHIVP, CPP Infectious Disease Pharmacist 11/10/2019 5:17 PM

## 2019-11-10 NOTE — OR Nursing (Signed)
Per protocol Dr. Leonia Reeves called at 11:47 A.M. on 11/10/2019 and confirmed there was not any foreign bodies in image.

## 2019-11-10 NOTE — H&P (Signed)
Addendum H&P  Phillip Chen presents today for definitive fixation of his degenerative lumbar disc disease and discogenic back pain.  Patient continues to have severe back buttock and radicular leg pain.  There is been no change in his clinical exam since his last office visit of 11/01/2019.  Surgical plan is to move forward with an anterior lumbar interbody fusion at L5-S1 with supplemental posterior fixation with pedicle screws.  I have gone over the surgery with the patient and reviewed all the risks benefits and alternatives.  All of his questions were answered.

## 2019-11-10 NOTE — Anesthesia Procedure Notes (Signed)
Arterial Line Insertion Start/End6/16/2021 8:15 AM, 11/10/2019 8:25 AM Performed by: Kendricks Reap T, Immunologist, CRNA  Patient location: Pre-op. Lidocaine 1% used for infiltration and patient sedated Left, radial was placed Catheter size: 20 G Hand hygiene performed  and maximum sterile barriers used   Attempts: 2 Procedure performed without using ultrasound guided technique. Following insertion, dressing applied and Biopatch. Post procedure assessment: normal  Patient tolerated the procedure well with no immediate complications.

## 2019-11-10 NOTE — Anesthesia Procedure Notes (Signed)
Procedure Name: Intubation Date/Time: 11/10/2019 8:45 AM Performed by: Arvella Massingale T, CRNA Pre-anesthesia Checklist: Patient identified, Emergency Drugs available, Suction available and Patient being monitored Patient Re-evaluated:Patient Re-evaluated prior to induction Oxygen Delivery Method: Circle system utilized Preoxygenation: Pre-oxygenation with 100% oxygen Induction Type: IV induction Ventilation: Mask ventilation without difficulty and Oral airway inserted - appropriate to patient size Laryngoscope Size: Miller and 3 Grade View: Grade I Tube type: Oral Tube size: 8.0 mm Number of attempts: 1 Airway Equipment and Method: Stylet and Oral airway Placement Confirmation: ETT inserted through vocal cords under direct vision,  positive ETCO2 and breath sounds checked- equal and bilateral Secured at: 22 cm Tube secured with: Tape Dental Injury: Teeth and Oropharynx as per pre-operative assessment

## 2019-11-10 NOTE — Interval H&P Note (Signed)
History and Physical Interval Note:  11/10/2019 8:03 AM  Phillip Chen  has presented today for surgery, with the diagnosis of Isthesis grade 2 slip.  The various methods of treatment have been discussed with the patient and family. After consideration of risks, benefits and other options for treatment, the patient has consented to  Procedure(s) with comments: ANTERIOR LUMBAR FUSION (ALIF) L5-S1, POSTERIOR SPINAL FUSION INTERBODY L5-S1 (N/A) - 5.5 hrs Dr. Trula Slade to do approach Left sided tap block with exparel ABDOMINAL EXPOSURE (N/A) as a surgical intervention.  The patient's history has been reviewed, patient examined, no change in status, stable for surgery.  I have reviewed the patient's chart and labs.  Questions were answered to the patient's satisfaction.     Annamarie Major

## 2019-11-10 NOTE — Discharge Instructions (Signed)
  Spinal Fusion, Adult, Care After This sheet gives you information about how to care for yourself after your procedure. Your doctor may also give you more specific instructions. If you have problems or questions, contact your doctor. Follow these instructions at home: Medicines Take over-the-counter and prescription medicines only as told by your doctor. These include any medicines for pain or blood-thinning medicines (anticoagulants). If you were prescribed an antibiotic medicine, take it as told by your doctor. Do not stop taking the antibiotic even if you start to feel better. Do not drive for 24 hours if you were given a medicine to help you relax (sedative) during your procedure. Do not drive or use heavy machinery while taking prescription pain medicine. If you have a brace: Wear the brace as told by your doctor. Take it off only as told by your doctor. Keep the brace clean. Managing pain, stiffness, and swelling If directed, put ice on the surgery area: If you have a removable brace, take it off as told by your doctor. Put ice in a plastic bag. Place a towel between your skin and the bag. Leave the ice on for 20 minutes, 2-3 times a day. Surgery cut care    Follow instructions from your doctor about how to take care of your cut from surgery (incision). Make sure you: Wash your hands with soap and water before you change your bandage (dressing). If you cannot use soap and water, use hand sanitizer. Change your bandage as told by your doctor. Leave stitches (sutures), skin glue, or skin tape (adhesive) strips in place. They may need to stay in place for 2 weeks or longer. If tape strips get loose and curl up, you may trim the loose edges. Do not remove tape strips completely unless your doctor says it is okay. Keep your cut from surgery clean and dry. Do not take baths, swim, or use a hot tub until your doctor says it is okay. Ask your doctor if you can take showers. You may only  be allowed to take sponge baths. Every day, check your cut from surgery and the area around it for: More redness, swelling, or pain. Fluid or blood. Warmth. Pus or a bad smell. If you have a drain tube, follow instructions from your doctor about caring for it. Do not take out the drain tube or any bandages unless your doctor says it is okay. Physical activity Rest and protect your back as much as possible. Follow instructions from your doctor about how to move. Use good posture to help your spine heal. Do not lift anything that is heavier than 8 lb (3.6 kg), or the limit that you are told, until your doctor says that it is safe. Do not twist or bend at the waist until your doctor says it is okay. It is best if you: Do not make pushing and pulling motions. Do not sit or lie down in the same position for a long time. Do not raise your hands or arms above your head. Return to your normal activities as told by your doctor. Ask your doctor what activities are safe for you. Rest and protect your back as much as you can. Do not start to exercise until your doctor says it is okay. Ask your doctor what kinds of exercise you can do to make your back stronger. Ok to shower in 5 days.  Do not take a bath or submerge the wound General instructions To prevent blood clots and lessen swelling   lessen swelling in your legs: Wear compression stockings as told. Walk one or more times every few hours as told by your doctor. Do not use any products that contain nicotine or tobacco, such as cigarettes and e-cigarettes. These can delay bone healing. If you need help quitting, ask your doctor. To prevent or treat constipation while you are taking prescription pain medicine, your doctor may suggest that you: Drink enough fluid to keep your pee (urine) pale yellow. Take over-the-counter or prescription medicines. Eat foods that are high in fiber. These include fresh fruits and vegetables, whole grains, and beans. Limit foods  that are high in fat and processed sugars, such as fried and sweet foods. Keep all follow-up visits as told by your doctor. This is important. Contact a doctor if: Your pain gets worse. Your medicine does not help your pain. Your legs or feet get painful or swollen. Your cut from surgery is more red, swollen, or painful. Your cut from surgery feels warm to the touch. You have: Fluid or blood coming from your cut from surgery. Pus or a bad smell coming from your cut from surgery. A fever. Weakness or loss of feeling (numbness) in your legs that is new or getting worse. Trouble controlling when you pee (urinate) or poop (have a bowel movement). You feel sick to your stomach (nauseous). You throw up (vomit). Get help right away if: Your pain is very bad. You have chest pain. You have trouble breathing. You start to have a cough. These symptoms may be an emergency. Do not wait to see if the symptoms will go away. Get medical help right away. Call your local emergency services (911 in the [place] Do not drive yourself to the hospital. Summary After the procedure, it is common to have pain in your back and pain by your surgery cut(s). Icing and pain medicines may help to control the pain. Follow directions from your doctor. Rest and protect your back as much as possible. Do not twist or bend at the waist. Get up and walk one or more times every few hours as told by your doctor. This information is not intended to replace advice given to you by your health care provider. Make sure you discuss any questions you have with your health care provider.   Enoxaparin injection What is this medicine? ENOXAPARIN (ee nox a PA rin) is used after knee, hip, or abdominal surgeries to prevent blood clotting. It is also used to treat existing blood clots in the lungs or in the veins. This medicine may be used for other purposes; ask your health care provider or pharmacist if you have questions. COMMON BRAND  NAME(S): Lovenox What should I tell my health care provider before I take this medicine? They need to know if you have any of these conditions: -bleeding disorders, hemorrhage, or hemophilia -infection of the heart or heart valves -kidney or liver disease -previous stroke -prosthetic heart valve -recent surgery or delivery of a baby -ulcer in the stomach or intestine, diverticulitis, or other bowel disease -an unusual or allergic reaction to enoxaparin, heparin, pork or pork products, other medicines, foods, dyes, or preservatives -pregnant or trying to get pregnant -breast-feeding How should I use this medicine? This medicine is for injection under the skin. It is usually given by a health-care professional. You or a family member may be trained on how to give the injections. If you are to give yourself injections, make sure you understand how to use the syringe, measure the   dose if necessary, and give the injection. To avoid bruising, do not rub the site where this medicine has been injected. Do not take your medicine more often than directed. Do not stop taking except on the advice of your doctor or health care professional. Make sure you receive a puncture-resistant container to dispose of the needles and syringes once you have finished with them. Do not reuse these items. Return the container to your doctor or health care professional for proper disposal. Talk to your pediatrician regarding the use of this medicine in children. Special care may be needed. Overdosage: If you think you have taken too much of this medicine contact a poison control center or emergency room at once. NOTE: This medicine is only for you. Do not share this medicine with others. What if I miss a dose? If you miss a dose, take it as soon as you can. If it is almost time for your next dose, take only that dose. Do not take double or extra doses. What may interact with this medicine? -aspirin and aspirin-like  medicines -certain medicines that treat or prevent blood clots -dipyridamole -NSAIDs, medicines for pain and inflammation, like ibuprofen or naproxen This list may not describe all possible interactions. Give your health care provider a list of all the medicines, herbs, non-prescription drugs, or dietary supplements you use. Also tell them if you smoke, drink alcohol, or use illegal drugs. Some items may interact with your medicine. What should I watch for while using this medicine? Visit your healthcare professional for regular checks on your progress. You may need blood work done while you are taking this medicine. Your condition will be monitored carefully while you are receiving this medicine. It is important not to miss any appointments. If you are going to need surgery or other procedure, tell your healthcare professional that you are using this medicine. Using this medicine for a long time may weaken your bones and increase the risk of bone fractures. Avoid sports and activities that might cause injury while you are using this medicine. Severe falls or injuries can cause unseen bleeding. Be careful when using sharp tools or knives. Consider using an electric razor. Take special care brushing or flossing your teeth. Report any injuries, bruising, or red spots on the skin to your healthcare professional. Wear a medical ID bracelet or chain. Carry a card that describes your disease and details of your medicine and dosage times. What side effects may I notice from receiving this medicine? Side effects that you should report to your doctor or health care professional as soon as possible: -allergic reactions like skin rash, itching or hives, swelling of the face, lips, or tongue -bone pain -signs and symptoms of bleeding such as bloody or black, tarry stools; red or dark-brown urine; spitting up blood or brown material that looks like coffee grounds; red spots on the skin; unusual bruising or  bleeding from the eye, gums, or nose -signs and symptoms of a blood clot such as chest pain; shortness of breath; pain, swelling, or warmth in the leg -signs and symptoms of a stroke such as changes in vision; confusion; trouble speaking or understanding; severe headaches; sudden numbness or weakness of the face, arm or leg; trouble walking; dizziness; loss of coordination Side effects that usually do not require medical attention (report to your doctor or health care professional if they continue or are bothersome): -hair loss -pain, redness, or irritation at site where injected This list may not describe all possible   side effects. Call your doctor for medical advice about side effects. You may report side effects to FDA at 1-800-FDA-1088. Where should I keep my medicine? Keep out of the reach of children. Store at room temperature between 15 and 30 degrees C (59 and 86 degrees F). Do not freeze. If your injections have been specially prepared, you may need to store them in the refrigerator. Ask your pharmacist. Throw away any unused medicine after the expiration date. NOTE: This sheet is a summary. It may not cover all possible information. If you have questions about this medicine, talk to your doctor, pharmacist, or health care provider.  2019 [employer]vier/Gold Standard (2017-05-08 11:25:34)  

## 2019-11-10 NOTE — Brief Op Note (Signed)
11/10/2019  1:58 PM  PATIENT:  Phillip Chen  44 y.o. male  PRE-OPERATIVE DIAGNOSIS:  Isthesis grade 2 slip  POST-OPERATIVE DIAGNOSIS:  Isthesis grade 2 slip  PROCEDURE:  Procedure(s) with comments: ANTERIOR LUMBAR FUSION (ALIF) LUMBAR FIVE-SACRAL ONE, POSTERIOR SPINAL FUSION INTERBODY LUMBAR FIVE -SACRAL ONE (N/A) - 5.5 hrs Dr. Trula Slade to do approach Left sided tap block with exparel ABDOMINAL EXPOSURE (N/A)  SURGEON:  Surgeon(s) and Role: Panel 1:    Melina Schools, MD - Primary Panel 2:    * Serafina Mitchell, MD - Primary  PHYSICIAN ASSISTANT:   ASSISTANTS: Amanda Ward, PA   ANESTHESIA:   general  EBL:  200 mL   BLOOD ADMINISTERED:none  DRAINS: none   LOCAL MEDICATIONS USED:  MARCAINE     SPECIMEN:  No Specimen  DISPOSITION OF SPECIMEN:  N/A  COUNTS:  YES  TOURNIQUET:  * No tourniquets in log *  DICTATION: .Dragon Dictation  PLAN OF CARE: Admit to inpatient   PATIENT DISPOSITION:  PACU - hemodynamically stable.

## 2019-11-10 NOTE — Anesthesia Procedure Notes (Signed)
Anesthesia Regional Block: TAP block   Pre-Anesthetic Checklist: ,, timeout performed, Correct Patient, Correct Site, Correct Laterality, Correct Procedure, Correct Position, site marked, Risks and benefits discussed, pre-op evaluation,  At surgeon's request and post-op pain management  Laterality: Left  Prep: Maximum Sterile Barrier Precautions used, chloraprep       Needles:  Injection technique: Single-shot  Needle Type: Echogenic Stimulator Needle     Needle Length: 9cm  Needle Gauge: 21     Additional Needles:   Narrative:  Start time: 11/10/2019 8:18 AM End time: 11/10/2019 8:21 AM Injection made incrementally with aspirations every 5 mL. Anesthesiologist: Brennan Bailey, MD  Additional Notes: Risks, benefits, and alternative discussed. Patient gave consent for procedure. Patient prepped and draped in sterile fashion. Sedation administered, patient remains easily responsive to voice. Relevant anatomy identified with ultrasound guidance. Local anesthetic given in 5cc increments with no signs or symptoms of intravascular injection. No pain or paraesthesias with injection. Patient monitored throughout procedure with signs of LAST or immediate complications. Tolerated well. Ultrasound image placed in chart. Tawny Asal, MD

## 2019-11-10 NOTE — Transfer of Care (Signed)
Immediate Anesthesia Transfer of Care Note  Patient: Phillip Chen  Procedure(s) Performed: ANTERIOR LUMBAR FUSION (ALIF) LUMBAR FIVE-SACRAL ONE, POSTERIOR SPINAL FUSION INTERBODY LUMBAR FIVE -SACRAL ONE (N/A Spine Lumbar) ABDOMINAL EXPOSURE (N/A Abdomen)  Patient Location: PACU  Anesthesia Type:GA combined with regional for post-op pain  Level of Consciousness: drowsy  Airway & Oxygen Therapy: Patient Spontanous Breathing and Patient connected to nasal cannula oxygen  Post-op Assessment: Report given to RN, Post -op Vital signs reviewed and stable and Patient moving all extremities  Post vital signs: Reviewed and stable  Last Vitals:  Vitals Value Taken Time  BP    Temp    Pulse 102 11/10/19 1445  Resp 22 11/10/19 1445  SpO2 98 % 11/10/19 1445  Vitals shown include unvalidated device data.  Last Pain:  Vitals:   11/10/19 0646  TempSrc: Tympanic         Complications: No complications documented.

## 2019-11-11 ENCOUNTER — Inpatient Hospital Stay (HOSPITAL_COMMUNITY): Payer: BC Managed Care – PPO

## 2019-11-11 ENCOUNTER — Encounter (HOSPITAL_COMMUNITY): Payer: Self-pay | Admitting: Orthopedic Surgery

## 2019-11-11 DIAGNOSIS — Z01810 Encounter for preprocedural cardiovascular examination: Secondary | ICD-10-CM

## 2019-11-11 MED FILL — Thrombin (Recombinant) For Soln 5000 Unit: CUTANEOUS | Qty: 5000 | Status: AC

## 2019-11-11 NOTE — Progress Notes (Signed)
  Progress Note    11/11/2019 7:24 AM 1 Day Post-Op  Subjective: Tolerating liquid diet without nausea or vomiting.  He is passing flatus.  He has been out of bed and walked the hallways.  He is voiding spontaneously   Vitals:   11/10/19 2311 11/11/19 0346  BP: 121/69 134/77  Pulse: (!) 105 95  Resp: 20 20  Temp: 98.9 F (37.2 C) 98.3 F (36.8 C)  SpO2: 92% 96%    Physical Exam: Cardiac: Heart rate and rhythm are regular Lungs: Clear to auscultation bilaterally Incisions: Left lower quadrant abdominal incision is inspected.  Honeycomb bandage is in place.  No bleeding.  He has some mild erythema but this is due to ice packs he has in place Extremities: Active range of motion of both lower extremities Abdomen: Soft, nondistended, normoactive bowel sounds  CBC    Component Value Date/Time   WBC 7.6 11/08/2019 0949   RBC 5.23 11/08/2019 0949   HGB 16.0 11/08/2019 0949   HCT 47.5 11/08/2019 0949   PLT 249 11/08/2019 0949   MCV 90.8 11/08/2019 0949   MCH 30.6 11/08/2019 0949   MCHC 33.7 11/08/2019 0949   RDW 11.9 11/08/2019 0949    BMET    Component Value Date/Time   NA 140 11/08/2019 0949   K 4.1 11/08/2019 0949   CL 108 11/08/2019 0949   CO2 24 11/08/2019 0949   GLUCOSE 110 (H) 11/08/2019 0949   BUN 12 11/08/2019 0949   CREATININE 0.77 11/08/2019 0949   CALCIUM 9.2 11/08/2019 0949   GFRNONAA >60 11/08/2019 0949   GFRAA >60 11/08/2019 0949     Intake/Output Summary (Last 24 hours) at 11/11/2019 0724 Last data filed at 11/10/2019 1410 Gross per 24 hour  Intake 2800 ml  Output 865 ml  Net 1935 ml    HOSPITAL MEDICATIONS Scheduled Meds: . docusate sodium  100 mg Oral BID  . enoxaparin (LOVENOX) injection  40 mg Subcutaneous Q24H  . escitalopram  10 mg Oral Daily  . fluticasone furoate-vilanterol  1 puff Inhalation Daily  . gabapentin  300 mg Oral TID  . lisinopril  10 mg Oral Daily   And  . hydrochlorothiazide  12.5 mg Oral Daily   Continuous  Infusions: . lactated ringers Stopped (11/10/19 2353)  . methocarbamol (ROBAXIN) IV     PRN Meds:.acetaminophen **OR** acetaminophen, albuterol, bisacodyl, menthol-cetylpyridinium **OR** phenol, methocarbamol **OR** methocarbamol (ROBAXIN) IV, ondansetron **OR** ondansetron (ZOFRAN) IV, oxyCODONE, oxyCODONE, polyethylene glycol, sodium phosphate  Assessment: POD 1     Anterior exposure of the L5-S1 disc space.  Stable postop from vascular surgery perspective.   Plan: -Diet advanced this morning. Discussed incisional care.  Plan as per primary service DVT prophylaxis:  Lovenox   Risa Grill, PA-C [facility] [phone] 11/11/2019  7:24 AM

## 2019-11-11 NOTE — Progress Notes (Signed)
Venous duplex       has been completed. Preliminary results can be found under CV proc through chart review. Bryley Kovacevic, BS, RDMS, RVT   

## 2019-11-11 NOTE — Progress Notes (Signed)
    Subjective: Procedure(s) (LRB): ANTERIOR LUMBAR FUSION (ALIF) LUMBAR FIVE-SACRAL ONE, POSTERIOR SPINAL FUSION INTERBODY LUMBAR FIVE -SACRAL ONE (N/A) ABDOMINAL EXPOSURE (N/A) 1 Day Post-Op  Patient reports pain as 2 on 0-10 scale.  Reports decreased leg pain reports incisional back pain   Positive void Negative bowel movement Positive flatus Negative chest pain or shortness of breath  Objective: Vital signs in last 24 hours: Temp:  [98.3 F (36.8 C)-99.5 F (37.5 C)] 98.3 F (36.8 C) (06/17 0346) Pulse Rate:  [84-105] 95 (06/17 0346) Resp:  [15-26] 20 (06/17 0346) BP: (104-138)/(68-86) 134/77 (06/17 0346) SpO2:  [92 %-99 %] 96 % (06/17 0346) Arterial Line BP: (133-153)/(56-68) 133/56 (06/16 1515)  Intake/Output from previous day: 06/16 0701 - 06/17 0700 In: 2800 [I.V.:2800] Out: 865 [Urine:665; Blood:200]  Labs: Recent Labs    11/08/19 0949  WBC 7.6  RBC 5.23  HCT 47.5  PLT 249   Recent Labs    11/08/19 0949  NA 140  K 4.1  CL 108  CO2 24  BUN 12  CREATININE 0.77  GLUCOSE 110*  CALCIUM 9.2   Recent Labs    11/08/19 0949  INR 1.1    Physical Exam: Neurologically intact ABD soft Intact pulses distally Dorsiflexion/Plantar flexion intact Incision: dressing C/D/I and no drainage Compartment soft Body mass index is 34.17 kg/m.   Assessment/Plan: Patient stable  xrays n/a Continue mobilization with physical therapy Continue care  Advance diet Up with therapy  1. LE doppler pending 2. Start lovenox as DVT prevention 3. Ambulating without significant pain 4. Plan on d/c this afternoon if cleared by PT and doppler completed   Melina Schools, MD [facility] [phone]

## 2019-11-11 NOTE — Progress Notes (Signed)
Patient is discharged from room 3C06 at this time. Alert and in stable condition. IV site d/c'd and instructions read to patient and spouse with understanding verbalized and all questions answered. Left unit via wheelchair with all belongings at side 

## 2019-11-11 NOTE — Evaluation (Signed)
Occupational Therapy Evaluation Patient Details Name: Phillip Chen MRN: [id number] DOB: 12-Dec-1975 Today's Date: 11/11/2019    History of Present Illness Patient presents with degenerative lumbar disc disease and discogenic back pain s/p ALIF at L5-S1 with supplemental posterior fixation with pedicle screws on 11/10/19 by Dr. Trula Slade. PMHx significant for chronic back pain, GERD, HTN, and OSA on CPAP.    Clinical Impression   Prior to hospital admission, patient was living with his wife in a private residence with a full flight of steps to enter. Patient was independent with BADLs/IADLs and was working full-time outside of the home. Patient currently presents slightly below baseline level of function requiring supervision A for toilet transfers, set-up assist for all UB BADLs, and Min A grossly for LB BADLs including bathing/dressing. Patient/family written and verbal education provided on back precautions, use and acquisition of AE to maximize independence with LB BADLs, and safety with functional transfers. Patient and wife expressed verbal understanding. Patient does not demonstrate need for acute OT services. No follow-up recommendations for post acute rehab at this time.     Follow Up Recommendations  No OT follow up;Supervision/Assistance - 24 hour    Equipment Recommendations  3 in 1 bedside commode    Recommendations for Other Services       Precautions / Restrictions Precautions Precautions: Back Precaution Booklet Issued: Yes (comment) Required Braces or Orthoses: Spinal Brace Spinal Brace: Lumbar corset;Applied in sitting position Restrictions Weight Bearing Restrictions: No      Mobility Bed Mobility Overal bed mobility: Needs Assistance Bed Mobility: Supine to Sit     Supine to sit: Supervision;HOB elevated     General bed mobility comments: Verbal cues for log rolling  Transfers Overall transfer level: Needs assistance Equipment used: None Transfers: Sit  to/from Stand;Stand Pivot Transfers Sit to Stand: Supervision;From elevated surface Stand pivot transfers: Supervision       General transfer comment: Sit to stand from elevated EOB with supervision A and SPT to commode in bathroom with supervision A and cues for adherence to back precautions    Balance Overall balance assessment: Mild deficits observed, not formally tested                                         ADL either performed or assessed with clinical judgement   ADL Overall ADL's : Needs assistance/impaired     Grooming: Min guard           Upper Body Dressing : Set up   Lower Body Dressing: Minimal assistance Lower Body Dressing Details (indicate cue type and reason): Assist to don/doff footwear without use of AE Toilet Transfer: Min Psychiatric nurse Details (indicate cue type and reason): To standard commode with grab bar Toileting- Clothing Manipulation and Hygiene: Min guard;Adhering to back precautions;Sit to/from stand       Functional mobility during ADLs: Supervision/safety       Vision Baseline Vision/History: Wears glasses Wears Glasses: Reading only Patient Visual Report: No change from baseline Vision Assessment?: Yes;No apparent visual deficits     Perception     Praxis      Pertinent Vitals/Pain       Hand Dominance Right   Extremity/Trunk Assessment Upper Extremity Assessment Upper Extremity Assessment: Overall WFL for tasks assessed   Lower Extremity Assessment Lower Extremity Assessment: Defer to PT evaluation   Cervical / Trunk Assessment Cervical / Trunk  Assessment: Normal   Communication Communication Communication: No difficulties   Cognition Arousal/Alertness: Awake/alert Behavior During Therapy: WFL for tasks assessed/performed Overall Cognitive Status: Within Functional Limits for tasks assessed                                     General Comments  Wife present for  patient/family education    Exercises     Shoulder Instructions      Home Living Family/patient expects to be discharged to:: Private residence Living Arrangements: Spouse/significant other Available Help at Discharge: Family;Available 24 hours/day (Wife works from home and is able to provide assist as needed) Type of Home: House Home Access: Stairs to enter [employer] of Steps: Full flight from basement. 3 STE from front entry.  Entrance Stairs-Rails: Right (Ascending) Home Layout: Laundry or work area in basement;One level     Bathroom Shower/Tub: Occupational psychologist: Standard     Home Equipment: None (Wife to purchase shower chair)          Prior Functioning/Environment Level of Independence: Independent        Comments: Wife responsible for IADLs. Patient was independent with BADLs and was working full-time outside of the home.        OT Problem List: Decreased knowledge of use of DME or AE;Pain      OT Treatment/Interventions:      OT Goals(Current goals can be found in the care plan section) Acute Rehab OT Goals Patient Stated Goal: To return home.  OT Frequency:     Barriers to D/C:            Co-evaluation              AM-PAC OT "6 Clicks" Daily Activity     Outcome Measure Help from another person eating meals?: None Help from another person taking care of personal grooming?: A Little Help from another person toileting, which includes using toliet, bedpan, or urinal?: A Little Help from another person bathing (including washing, rinsing, drying)?: A Little Help from another person to put on and taking off regular upper body clothing?: None Help from another person to put on and taking off regular lower body clothing?: A Little 6 Click Score: 20   End of Session Equipment Utilized During Treatment: Gait belt;Back brace Nurse Communication: Other (comment) (Patient location)  Activity Tolerance: Patient tolerated  treatment well Patient left: Other (comment);with call bell/phone within reach;with family/visitor present (Patient seated on commode with wife present. Staff aware.)  OT Visit Diagnosis: Muscle weakness (generalized) (M62.81)                Time: 4656-8127 OT Time Calculation (min): 38 min Charges:  OT General Charges $OT Visit: 1 Visit OT Evaluation $OT Eval Moderate Complexity: 1 Mod OT Treatments $Self Care/Home Management : 8-22 mins  Cristabel Bicknell H. OTR/L Supplemental OT, Department of rehab services [phone]  Shadae Reino R H. 11/11/2019, 9:49 AM

## 2019-11-11 NOTE — Progress Notes (Signed)
    Subjective  - POD #1  Complaining of some discomfort in his left lower quadrant   Physical Exam:  Abdomen soft Dressing is intact Palpable pedal pulses     Assessment/Plan:  POD #1, status post anterior exposure for L5-S1 instrumentation  The patient is recovering appropriately.  He has no complicating sequela from the anterior exposure  Phillip Chen 11/11/2019 7:49 AM --  Vitals:   11/11/19 0346 11/11/19 0739  BP: 134/77 121/73  Pulse: 95 80  Resp: 20 16  Temp: 98.3 F (36.8 C) 98.4 F (36.9 C)  SpO2: 96% 98%    Intake/Output Summary (Last 24 hours) at 11/11/2019 0749 Last data filed at 11/10/2019 1410 Gross per 24 hour  Intake 2800 ml  Output 865 ml  Net 1935 ml     Laboratory CBC    Component Value Date/Time   WBC 7.6 11/08/2019 0949   HGB 16.0 11/08/2019 0949   HCT 47.5 11/08/2019 0949   PLT 249 11/08/2019 0949    BMET    Component Value Date/Time   NA 140 11/08/2019 0949   K 4.1 11/08/2019 0949   CL 108 11/08/2019 0949   CO2 24 11/08/2019 0949   GLUCOSE 110 (H) 11/08/2019 0949   BUN 12 11/08/2019 0949   CREATININE 0.77 11/08/2019 0949   CALCIUM 9.2 11/08/2019 0949   GFRNONAA >60 11/08/2019 0949   GFRAA >60 11/08/2019 0949    COAG Lab Results  Component Value Date   INR 1.1 11/08/2019   No results found for: PTT  Antibiotics Anti-infectives (From admission, onward)   Start     Dose/Rate Route Frequency Ordered Stop   11/10/19 2000  vancomycin (VANCOCIN) IVPB 1000 mg/200 mL premix        1,000 mg 200 mL/hr over 60 Minutes Intravenous  Once 11/10/19 1718 11/10/19 2354   11/10/19 0600  vancomycin (VANCOREADY) IVPB 1500 mg/300 mL        1,500 mg 150 mL/hr over 120 Minutes Intravenous 120 min pre-op 11/09/19 0853 11/10/19 2354       V. Leia Alf, M.D., [facility] [place] Office: [phone] Pager:  [phone]

## 2019-11-11 NOTE — Evaluation (Signed)
Physical Therapy Evaluation and Discharge Patient Details Name: Phillip Chen MRN: [id number] DOB: Jun 18, 1975 Today's Date: 11/11/2019   History of Present Illness  Patient presents with degenerative lumbar disc disease and discogenic back pain s/p ALIF at L5-S1 with supplemental posterior fixation with pedicle screws on 11/10/19 by Dr. Trula Slade. PMHx significant for chronic back pain, GERD, HTN, and OSA on CPAP.   Clinical Impression  Pt admitted with above diagnosis. At the time of PT eval, pt was able to demonstrate transfers and ambulation with gross supervision for safety and no AD. Pt was educated on precautions, brace application/wearing schedule, appropriate activity progression, and car transfer. Pt currently with functional limitations due to the deficits listed below (see PT Problem List). Pt will benefit from skilled PT to increase their independence and safety with mobility to allow discharge to the venue listed below.      Follow Up Recommendations No PT follow up;Supervision - Intermittent    Equipment Recommendations  None recommended by PT    Recommendations for Other Services       Precautions / Restrictions Precautions Precautions: Back Precaution Booklet Issued: Yes (comment) Required Braces or Orthoses: Spinal Brace Spinal Brace: Lumbar corset;Applied in sitting position Restrictions Weight Bearing Restrictions: No      Mobility  Bed Mobility Overal bed mobility: Needs Assistance Bed Mobility: Supine to Sit     Supine to sit: Supervision;HOB elevated     General bed mobility comments: Verbal cues for log rolling  Transfers Overall transfer level: Needs assistance Equipment used: None Transfers: Sit to/from Stand Sit to Stand: Supervision;From elevated surface Stand pivot transfers: Supervision       General transfer comment: Pt demonstrated proper hand placement on seated surface for safety. No assist to power-up to full stand.    Ambulation/Gait Ambulation/Gait assistance: Supervision Gait Distance (Feet): 250 Feet Assistive device: None Gait Pattern/deviations: Step-through pattern;Decreased stride length Gait velocity: Decreased Gait velocity interpretation: <1.8 ft/sec, indicate of risk for recurrent falls General Gait Details: Short step/stride length and decreased trunk rotation. Able to make min corrective changes with VC's  Stairs Stairs: Yes Stairs assistance: Supervision Stair Management: One rail Right;Step to pattern;Alternating pattern Number of Stairs: 10 General stair comments: VC's for sequencing and general safety. No unsteadiness or LOB noted.   Wheelchair Mobility    Modified Rankin (Stroke Patients Only)       Balance Overall balance assessment: Mild deficits observed, not formally tested                                           Pertinent Vitals/Pain Pain Assessment: No/denies pain    Home Living Family/patient expects to be discharged to:: Private residence Living Arrangements: Spouse/significant other Available Help at Discharge: Family;Available 24 hours/day (Wife works from home and is able to provide assist as needed) Type of Home: House Home Access: Stairs to enter Entrance Stairs-Rails: Right (Ascending) Entrance Stairs-Number of Steps: Full flight from basement. 3 STE from front entry.  Home Layout: Laundry or work area in basement;One level Home Equipment: None (Wife to purchase shower chair)      Prior Function Level of Independence: Independent         Comments: Wife responsible for IADLs. Patient was independent with BADLs and was working full-time outside of the home.     Hand Dominance   Dominant Hand: Right    Extremity/Trunk Assessment  Upper Extremity Assessment Upper Extremity Assessment: Defer to OT evaluation    Lower Extremity Assessment Lower Extremity Assessment: Generalized weakness    Cervical / Trunk  Assessment Cervical / Trunk Assessment: Other exceptions Cervical / Trunk Exceptions: s/p surgery.  Communication   Communication: No difficulties  Cognition Arousal/Alertness: Awake/alert Behavior During Therapy: WFL for tasks assessed/performed Overall Cognitive Status: Within Functional Limits for tasks assessed                                        General Comments General comments (skin integrity, edema, etc.): Wife present for patient/family education    Exercises     Assessment/Plan    PT Assessment Patient needs continued PT services  PT Problem List Decreased strength;Decreased activity tolerance;Decreased balance;Decreased mobility;Decreased knowledge of use of DME;Decreased knowledge of precautions;Decreased safety awareness;Pain       PT Treatment Interventions DME instruction;Gait training;Stair training;Functional mobility training;Therapeutic activities;Therapeutic exercise;Neuromuscular re-education;Patient/family education    PT Goals (Current goals can be found in the Care Plan section)  Acute Rehab PT Goals Patient Stated Goal: To return home. PT Goal Formulation: With patient/family Time For Goal Achievement: 11/18/19 Potential to Achieve Goals: Good    Frequency Min 5X/week   Barriers to discharge        Co-evaluation               AM-PAC PT "6 Clicks" Mobility  Outcome Measure Help needed turning from your back to your side while in a flat bed without using bedrails?: None Help needed moving from lying on your back to sitting on the side of a flat bed without using bedrails?: None Help needed moving to and from a bed to a chair (including a wheelchair)?: None Help needed standing up from a chair using your arms (e.g., wheelchair or bedside chair)?: None Help needed to walk in hospital room?: None Help needed climbing 3-5 steps with a railing? : None 6 Click Score: 24    End of Session Equipment Utilized During Treatment:  Gait belt Activity Tolerance: Patient tolerated treatment well Patient left: in chair;with call bell/phone within reach;with family/visitor present Nurse Communication: Mobility status PT Visit Diagnosis: Unsteadiness on feet (R26.81);Pain Pain - part of body:  (L incision site (abdomen))    Time: 0920-0940 PT Time Calculation (min) (ACUTE ONLY): 20 min   Charges:   PT Evaluation $PT Eval Low Complexity: 1 Low          Rolinda Roan, PT, DPT Acute Rehabilitation Services Pager: [phone] Office: [phone]   Thelma Comp 11/11/2019, 11:43 AM

## 2019-11-17 MED FILL — Sodium Chloride IV Soln 0.9%: INTRAVENOUS | Qty: 1000 | Status: AC

## 2019-11-17 MED FILL — Heparin Sodium (Porcine) Inj 1000 Unit/ML: INTRAMUSCULAR | Qty: 30 | Status: AC

## 2019-11-19 NOTE — Discharge Summary (Signed)
Patient ID: Phillip Chen MRN: [id number] DOB/AGE: 1976-01-25 44 y.o.  Admit date: 11/10/2019 Discharge date: 11/19/2019  Admission Diagnoses:  Active Problems:   Fusion of lumbar spine   Discharge Diagnoses:  Active Problems:   Fusion of lumbar spine  status post Procedure(s): ANTERIOR LUMBAR FUSION (ALIF) LUMBAR FIVE-SACRAL ONE, POSTERIOR SPINAL FUSION INTERBODY LUMBAR FIVE -SACRAL ONE ABDOMINAL EXPOSURE  Past Medical History:  Diagnosis Date  . Anxiety   . Asthma   . Chronic back pain   . GERD (gastroesophageal reflux disease)   . HTN (hypertension)   . Sleep apnea    CPAP    Surgeries: Procedure(s): ANTERIOR LUMBAR FUSION (ALIF) LUMBAR FIVE-SACRAL ONE, POSTERIOR SPINAL FUSION INTERBODY LUMBAR FIVE -SACRAL ONE ABDOMINAL EXPOSURE on 11/10/2019   Consultants: Treatment Team:  Serafina Mitchell, MD  Discharged Condition: Improved  Hospital Course: Phillip Chen is an 44 y.o. male who was admitted 11/10/2019 for operative treatment of degenerative lumbar disc disease and discogenic back pain. Patient failed conservative treatments (please see the history and physical for the specifics) and had severe unremitting pain that affects sleep, daily activities and work/hobbies. After pre-op clearance, the patient was taken to the operating room on 11/10/2019 and underwent  Procedure(s): ANTERIOR LUMBAR FUSION (ALIF) LUMBAR FIVE-SACRAL ONE, POSTERIOR SPINAL FUSION INTERBODY LUMBAR FIVE -SACRAL ONE ABDOMINAL EXPOSURE.    Patient was given perioperative antibiotics:  Anti-infectives (From admission, onward)   Start     Dose/Rate Route Frequency Ordered Stop   11/10/19 2000  vancomycin (VANCOCIN) IVPB 1000 mg/200 mL premix        1,000 mg 200 mL/hr over 60 Minutes Intravenous  Once 11/10/19 1718 11/10/19 2354   11/10/19 0600  vancomycin (VANCOREADY) IVPB 1500 mg/300 mL        1,500 mg 150 mL/hr over 120 Minutes Intravenous 120 min pre-op 11/09/19 0853 11/10/19 2354        Patient was given sequential compression devices and early ambulation to prevent DVT.   Patient benefited maximally from hospital stay and there were no complications. At the time of discharge, the patient was urinating/moving their bowels without difficulty, tolerating a regular diet, pain is controlled with oral pain medications and they have been cleared by PT/OT.   Recent vital signs: No data found.   Recent laboratory studies: No results for input(s): WBC, HGB, HCT, PLT, NA, K, CL, CO2, BUN, CREATININE, GLUCOSE, INR, CALCIUM in the last 72 hours.  Invalid input(s): PT, 2   Discharge Medications:   Allergies as of 11/11/2019      Reactions   Penicillins    Childhood allergy   Sulfa Antibiotics    Childhood allergy      Medication List    STOP taking these medications   traMADol 50 MG tablet Commonly known as: ULTRAM     TAKE these medications   albuterol 108 (90 Base) MCG/ACT inhaler Commonly known as: VENTOLIN HFA Inhale into the lungs every 6 (six) hours as needed for wheezing or shortness of breath.   enoxaparin 40 MG/0.4ML injection Commonly known as: LOVENOX Inject 0.4 mLs (40 mg total) into the skin daily for 10 doses. 10 day supply 1 injection per day   escitalopram 10 MG tablet Commonly known as: LEXAPRO Take 10 mg by mouth daily.   Fluticasone-Salmeterol 100-50 MCG/DOSE Aepb Commonly known as: ADVAIR Inhale 1 puff into the lungs daily.   lisinopril-hydrochlorothiazide 10-12.5 MG tablet Commonly known as: ZESTORETIC Take 1 tablet by mouth daily.   omeprazole 20  MG capsule Commonly known as: PRILOSEC Take 1 capsule (20 mg total) by mouth 2 (two) times daily before a meal. What changed: when to take this   ondansetron 4 MG tablet Commonly known as: Zofran Take 1 tablet (4 mg total) by mouth every 8 (eight) hours as needed for nausea or vomiting.     ASK your doctor about these medications   methocarbamol 500 MG tablet Commonly known as:  Robaxin Take 1 tablet (500 mg total) by mouth every 8 (eight) hours as needed for up to 5 days for muscle spasms. Ask about: Should I take this medication?   oxyCODONE-acetaminophen 10-325 MG tablet Commonly known as: Percocet Take 1 tablet by mouth every 6 (six) hours as needed for up to 5 days for pain. Ask about: Should I take this medication?       Diagnostic Studies: DG Chest 2 View  Result Date: 11/08/2019 CLINICAL DATA:  Preoperative examination. Patient for spinal fusion. EXAM: CHEST - 2 VIEW COMPARISON:  None. FINDINGS: Lungs clear. Heart size normal. No pneumothorax or pleural fluid. No acute or focal bony abnormality. IMPRESSION: No acute disease. Electronically Signed   By: Inge Rise M.D.   On: 11/08/2019 15:07   DG Lumbar Spine 2-3 Views  Result Date: 11/10/2019 CLINICAL DATA:  L5-S1 ALIF. EXAM: LUMBAR SPINE - 2-3 VIEW; DG C-ARM 1-60 MIN COMPARISON:  Lumbar MRI 09/01/2019. FINDINGS: C-arm fluoroscopy was provided in the operating room. 4 minutes and 9 seconds of fluoroscopy time. Frontal and lateral spot fluoroscopic images of the lower lumbar spine are submitted. These demonstrate anterior discectomy at L5-S1. In addition, there are bilateral pedicle screws and interconnecting rods. 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 IMPRESSION: Intraoperative views following anterior and posterior lumbar interbody fusion. Electronically Signed   By: Richardean Sale M.D.   On: 11/10/2019 16:00   DG C-Arm 1-60 Min  Result Date: 11/10/2019 CLINICAL DATA:  L5-S1 ALIF. EXAM: LUMBAR SPINE - 2-3 VIEW; DG C-ARM 1-60 MIN COMPARISON:  Lumbar MRI 09/01/2019. FINDINGS: C-arm fluoroscopy was provided in the operating room. 4 minutes and 9 seconds of fluoroscopy time. Frontal and lateral spot fluoroscopic images of the lower lumbar spine are submitted. These demonstrate anterior discectomy at L5-S1. In addition, there are bilateral pedicle  screws and interconnecting rods. 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 IMPRESSION: Intraoperative views following anterior and posterior lumbar interbody fusion. Electronically Signed   By: Richardean Sale M.D.   On: 11/10/2019 16:00   VAS [place] LOWER EXTREMITY VENOUS (DVT)  Result Date: 11/11/2019  Lower Venous DVTStudy Indications: S/p ALIF surgery.  Performing Technologist: June Leap RDMS, RVT  Examination Guidelines: A complete evaluation includes B-mode imaging, spectral Doppler, color Doppler, and power Doppler as needed of all accessible portions of each vessel. Bilateral testing is considered an integral part of a complete examination. Limited examinations for reoccurring indications may be performed as noted. The reflux portion of the exam is performed with the patient in reverse Trendelenburg.  +---------+---------------+---------+-----------+----------+--------------+ RIGHT    CompressibilityPhasicitySpontaneityPropertiesThrombus Aging +---------+---------------+---------+-----------+----------+--------------+ CFV      Full           Yes      Yes                                 +---------+---------------+---------+-----------+----------+--------------+ SFJ      Full                                                        +---------+---------------+---------+-----------+----------+--------------+  FV Prox  Full                                                        +---------+---------------+---------+-----------+----------+--------------+ FV Mid   Full                                                        +---------+---------------+---------+-----------+----------+--------------+ FV DistalFull                                                        +---------+---------------+---------+-----------+----------+--------------+ PFV      Full                                                         +---------+---------------+---------+-----------+----------+--------------+ POP      Full           Yes      Yes                                 +---------+---------------+---------+-----------+----------+--------------+ PTV      Full                                                        +---------+---------------+---------+-----------+----------+--------------+ PERO     Full                                                        +---------+---------------+---------+-----------+----------+--------------+   +---------+---------------+---------+-----------+----------+--------------+ LEFT     CompressibilityPhasicitySpontaneityPropertiesThrombus Aging +---------+---------------+---------+-----------+----------+--------------+ CFV      Full           Yes      Yes                                 +---------+---------------+---------+-----------+----------+--------------+ SFJ      Full                                                        +---------+---------------+---------+-----------+----------+--------------+ FV Prox  Full                                                        +---------+---------------+---------+-----------+----------+--------------+  FV Mid   Full                                                        +---------+---------------+---------+-----------+----------+--------------+ FV DistalFull                                                        +---------+---------------+---------+-----------+----------+--------------+ PFV      Full                                                        +---------+---------------+---------+-----------+----------+--------------+ POP      Full           Yes      Yes                                 +---------+---------------+---------+-----------+----------+--------------+ PTV      Full                                                         +---------+---------------+---------+-----------+----------+--------------+ PERO     Full                                                        +---------+---------------+---------+-----------+----------+--------------+     Summary: BILATERAL: - No evidence of deep vein thrombosis seen in the lower extremities, bilaterally. -No evidence of popliteal cyst, bilaterally.   *See table(s) above for measurements and observations. Electronically signed by Monica Martinez MD on 11/11/2019 at 3:20:57 PM.    Final    DG OR LOCAL ABDOMEN  Result Date: 11/10/2019 CLINICAL DATA:  Lumbar fusion EXAM: OR LOCAL ABDOMEN COMPARISON:  None. FINDINGS: L5-S1 fusion device noted. No radiodense surgical instruments in the pelvic field of view IMPRESSION: No radiodense surgical instruments. Electronically Signed   By: Suzy Bouchard M.D.   On: 11/10/2019 11:48    Discharge Instructions    Incentive spirometry RT   Complete by: As directed        Follow-up Information    Melina Schools, MD. Schedule an appointment as soon as possible for a visit in 2 weeks.   Specialty: Orthopedic Surgery Why: If symptoms worsen, For suture removal, For wound re-check Contact information: [address] [place] [address] [phone]               Discharge Plan:  discharge to home  Disposition: stable    Signed: Yvonne Kendall Maggie Dworkin for [facility] PA-C [facility] [phone] 11/19/2019, 8:53 AM

## 2019-12-17 DIAGNOSIS — Z13220 Encounter for screening for lipoid disorders: Secondary | ICD-10-CM | POA: Diagnosis not present

## 2019-12-17 DIAGNOSIS — R739 Hyperglycemia, unspecified: Secondary | ICD-10-CM | POA: Diagnosis not present

## 2019-12-17 DIAGNOSIS — G4733 Obstructive sleep apnea (adult) (pediatric): Secondary | ICD-10-CM | POA: Diagnosis not present

## 2019-12-17 DIAGNOSIS — I10 Essential (primary) hypertension: Secondary | ICD-10-CM | POA: Diagnosis not present

## 2019-12-17 DIAGNOSIS — K219 Gastro-esophageal reflux disease without esophagitis: Secondary | ICD-10-CM | POA: Diagnosis not present

## 2019-12-21 DIAGNOSIS — M545 Low back pain: Secondary | ICD-10-CM | POA: Diagnosis not present

## 2019-12-21 DIAGNOSIS — M4300 Spondylolysis, site unspecified: Secondary | ICD-10-CM | POA: Diagnosis not present

## 2019-12-21 DIAGNOSIS — M4310 Spondylolisthesis, site unspecified: Secondary | ICD-10-CM | POA: Diagnosis not present

## 2019-12-21 DIAGNOSIS — Z4889 Encounter for other specified surgical aftercare: Secondary | ICD-10-CM | POA: Diagnosis not present

## 2019-12-21 DIAGNOSIS — K219 Gastro-esophageal reflux disease without esophagitis: Secondary | ICD-10-CM | POA: Diagnosis not present

## 2019-12-27 DIAGNOSIS — Z4889 Encounter for other specified surgical aftercare: Secondary | ICD-10-CM | POA: Diagnosis not present

## 2019-12-27 DIAGNOSIS — M545 Low back pain: Secondary | ICD-10-CM | POA: Diagnosis not present

## 2019-12-31 DIAGNOSIS — Z4889 Encounter for other specified surgical aftercare: Secondary | ICD-10-CM | POA: Diagnosis not present

## 2019-12-31 DIAGNOSIS — M545 Low back pain: Secondary | ICD-10-CM | POA: Diagnosis not present

## 2020-01-03 DIAGNOSIS — Z4889 Encounter for other specified surgical aftercare: Secondary | ICD-10-CM | POA: Diagnosis not present

## 2020-01-03 DIAGNOSIS — M545 Low back pain: Secondary | ICD-10-CM | POA: Diagnosis not present

## 2020-01-07 DIAGNOSIS — Z4889 Encounter for other specified surgical aftercare: Secondary | ICD-10-CM | POA: Diagnosis not present

## 2020-01-07 DIAGNOSIS — M545 Low back pain: Secondary | ICD-10-CM | POA: Diagnosis not present

## 2020-01-10 DIAGNOSIS — Z4889 Encounter for other specified surgical aftercare: Secondary | ICD-10-CM | POA: Diagnosis not present

## 2020-01-10 DIAGNOSIS — M545 Low back pain: Secondary | ICD-10-CM | POA: Diagnosis not present

## 2020-01-13 ENCOUNTER — Other Ambulatory Visit: Payer: Self-pay | Admitting: Orthopedic Surgery

## 2020-01-13 ENCOUNTER — Telehealth: Payer: Self-pay | Admitting: Nurse Practitioner

## 2020-01-13 DIAGNOSIS — Z981 Arthrodesis status: Secondary | ICD-10-CM

## 2020-01-13 NOTE — Telephone Encounter (Signed)
Phone call to patient to verify medication list and allergies for myelogram procedure. Pt instructed to hold escitalopram for 48hrs prior to myelogram appointment time. Pt verbalized understanding. Pre and post procedure instructions reviewed with pt.

## 2020-01-14 DIAGNOSIS — Z4889 Encounter for other specified surgical aftercare: Secondary | ICD-10-CM | POA: Diagnosis not present

## 2020-01-14 DIAGNOSIS — M545 Low back pain: Secondary | ICD-10-CM | POA: Diagnosis not present

## 2020-01-19 ENCOUNTER — Ambulatory Visit
Admission: RE | Admit: 2020-01-19 | Discharge: 2020-01-19 | Disposition: A | Discharge status: Home / Self Care | Payer: BC Managed Care – PPO | Source: Ambulatory Visit | Attending: Orthopedic Surgery | Admitting: Orthopedic Surgery

## 2020-01-19 ENCOUNTER — Other Ambulatory Visit: Payer: Self-pay

## 2020-01-19 VITALS — BP 132/79 | HR 76

## 2020-01-19 DIAGNOSIS — M4326 Fusion of spine, lumbar region: Secondary | ICD-10-CM

## 2020-01-19 DIAGNOSIS — M5126 Other intervertebral disc displacement, lumbar region: Secondary | ICD-10-CM | POA: Diagnosis not present

## 2020-01-19 DIAGNOSIS — M4316 Spondylolisthesis, lumbar region: Secondary | ICD-10-CM | POA: Diagnosis not present

## 2020-01-19 DIAGNOSIS — Z981 Arthrodesis status: Secondary | ICD-10-CM

## 2020-01-19 MED ORDER — ONDANSETRON HCL 4 MG/2ML IJ SOLN
4.0000 mg | Freq: Once | INTRAMUSCULAR | Status: AC
  Administered 2020-01-19: 4 mg via INTRAMUSCULAR

## 2020-01-19 MED ORDER — IOPAMIDOL (ISOVUE-M 200) INJECTION 41%
20.0000 mL | Freq: Once | INTRAMUSCULAR | Status: AC
  Administered 2020-01-19: 20 mL via INTRATHECAL

## 2020-01-19 MED ORDER — DIAZEPAM 5 MG PO TABS
10.0000 mg | ORAL_TABLET | Freq: Once | ORAL | Status: AC
  Administered 2020-01-19: 10 mg via ORAL

## 2020-01-19 MED ORDER — MEPERIDINE HCL 100 MG/ML IJ SOLN
50.0000 mg | Freq: Once | INTRAMUSCULAR | Status: AC
  Administered 2020-01-19: 50 mg via INTRAMUSCULAR

## 2020-01-19 NOTE — Progress Notes (Signed)
Patient states he has been off Escitalopram for at least the past two days.

## 2020-01-19 NOTE — Discharge Instructions (Signed)

## 2020-01-24 DIAGNOSIS — Z4889 Encounter for other specified surgical aftercare: Secondary | ICD-10-CM | POA: Diagnosis not present

## 2020-01-24 DIAGNOSIS — M545 Low back pain: Secondary | ICD-10-CM | POA: Diagnosis not present

## 2020-01-28 DIAGNOSIS — Z4889 Encounter for other specified surgical aftercare: Secondary | ICD-10-CM | POA: Diagnosis not present

## 2020-01-28 DIAGNOSIS — M545 Low back pain: Secondary | ICD-10-CM | POA: Diagnosis not present

## 2020-02-06 DIAGNOSIS — M533 Sacrococcygeal disorders, not elsewhere classified: Secondary | ICD-10-CM | POA: Insufficient documentation

## 2020-02-09 DIAGNOSIS — M533 Sacrococcygeal disorders, not elsewhere classified: Secondary | ICD-10-CM | POA: Diagnosis not present

## 2020-03-06 DIAGNOSIS — Z981 Arthrodesis status: Secondary | ICD-10-CM | POA: Insufficient documentation

## 2020-03-06 NOTE — Progress Notes (Signed)
Referring Provider: Rory Percy, MD Primary Care Physician:  Rory Percy, MD Primary GI: Dr. Gala Romney   Chief Complaint  Patient presents with  . Gastroesophageal Reflux    doing okay. needs refill sent to pharmacy    HPI:   Phillip Chen is a 44 y.o. male presenting today with a history of GERD, here for routine follow-up. He had an EGD in Feb 2021 at outside facility due to esophageal thickening seen on CT. Findings of gastritis, duodenitis, fundic gland polyp that was benign. Colonoscopy for surveillance due in 2023.   BID Prilosec. No dysphagia. No abdominal pain. No breakthrough reflux. Drinks more water. Reduced caffeine intake. Used to drink sodas routinely but now just occasionally. Majority of food is grilled or air fryer. No overt GI bleeding. No constipation or diarrhea. Recently had spine surgery due to spinal stenosis.    Past Medical History:  Diagnosis Date  . Anxiety   . Asthma   . Chronic back pain   . GERD (gastroesophageal reflux disease)   . HTN (hypertension)   . Sleep apnea    CPAP    Past Surgical History:  Procedure Laterality Date  . ABDOMINAL EXPOSURE N/A 11/10/2019   Procedure: ABDOMINAL EXPOSURE;  Surgeon: Serafina Mitchell, MD;  Location: [facility] OR;  Service: Vascular;  Laterality: N/A;  . ANTERIOR LUMBAR FUSION N/A 11/10/2019   Procedure: ANTERIOR LUMBAR FUSION (ALIF) LUMBAR FIVE-SACRAL ONE, POSTERIOR SPINAL FUSION INTERBODY LUMBAR FIVE -SACRAL ONE;  Surgeon: Melina Schools, MD;  Location: [facility];  Service: Orthopedics;  Laterality: N/A;  5.5 hrs Dr. Trula Slade to do approach Left sided tap block with exparel  . COLONOSCOPY  05/26/2019   Dr. Rocco Serene; 2 hyperplastic polyp (0.5 cm and 0.4 cm), 4 fragments of tubular adenoma (0.3-0.6 cm), diverticulosis.  Recommended repeat colonoscopy in 3 years.  . ESOPHAGOGASTRODUODENOSCOPY  07/12/2019   Dr. Rocco Serene; moderate acute gastritis, duodenitis, fundic gland polyp with benign pathology.  CLOtest  negative.  Noted bile in his stomach which was seen refluxing into the esophagus and up to the level of his vocal cords.  . LUMBAR FUSION  10/2019  . SHOULDER SURGERY Left   . TONSILLECTOMY      Current Outpatient Medications  Medication Sig Dispense Refill  . albuterol (VENTOLIN HFA) 108 (90 Base) MCG/ACT inhaler Inhale into the lungs every 6 (six) hours as needed for wheezing or shortness of breath.    Marland Kitchen aspirin EC 81 MG tablet Take 81 mg by mouth daily. Swallow whole.    . escitalopram (LEXAPRO) 10 MG tablet Take 10 mg by mouth daily.    . Fluticasone-Salmeterol (ADVAIR) 100-50 MCG/DOSE AEPB Inhale 1 puff into the lungs daily.    Marland Kitchen gabapentin (NEURONTIN) 300 MG capsule Take 300 mg by mouth at bedtime.    Marland Kitchen HYDROcodone-acetaminophen (NORCO/VICODIN) 5-325 MG tablet 1 at bedtime    . lisinopril-hydrochlorothiazide (ZESTORETIC) 10-12.5 MG tablet Take 1 tablet by mouth daily.     . meloxicam (MOBIC) 15 MG tablet Take 15 mg by mouth daily.    Marland Kitchen omeprazole (PRILOSEC) 20 MG capsule Take 1 capsule (20 mg total) by mouth 2 (two) times daily before a meal. 180 capsule 3   No current facility-administered medications for this visit.    Allergies as of 03/07/2020 - Review Complete 03/07/2020  Allergen Reaction Noted  . Penicillins  07/01/2011  . Sulfa antibiotics  08/19/2019    Family History  Problem Relation Age of Onset  . Diverticulitis Father   .  Colon cancer Maternal Uncle     Social History   Socioeconomic History  . Marital status: Married    Spouse name: Not on file  . Number of children: Not on file  . Years of education: Not on file  . Highest education level: Not on file  Occupational History  . Not on file  Tobacco Use  . Smoking status: Former Smoker    Types: Cigarettes  . Smokeless tobacco: Never Used  Vaping Use  . Vaping Use: Never used  Substance and Sexual Activity  . Alcohol use: Yes    Comment: rare-once a month  . Drug use: Never  . Sexual activity:  Not on file  Other Topics Concern  . Not on file  Social History Narrative  . Not on file   Social Determinants of Health   Financial Resource Strain:   . Difficulty of Paying Living Expenses: Not on file  Food Insecurity:   . Worried About Charity fundraiser in the Last Year: Not on file  . Ran Out of Food in the Last Year: Not on file  Transportation Needs:   . Lack of Transportation (Medical): Not on file  . Lack of Transportation (Non-Medical): Not on file  Physical Activity:   . Days of Exercise per Week: Not on file  . Minutes of Exercise per Session: Not on file  Stress:   . Feeling of Stress : Not on file  Social Connections:   . Frequency of Communication with Friends and Family: Not on file  . Frequency of Social Gatherings with Friends and Family: Not on file  . Attends Religious Services: Not on file  . Active Member of Clubs or Organizations: Not on file  . Attends Archivist Meetings: Not on file  . Marital Status: Not on file    Review of Systems: Gen: Denies fever, chills, anorexia. Denies fatigue, weakness, weight loss.  CV: Denies chest pain, palpitations, syncope, peripheral edema, and claudication. Resp: Denies dyspnea at rest, cough, wheezing, coughing up blood, and pleurisy. GI: see HPI Derm: Denies rash, itching, dry skin Psych: Denies depression, anxiety, memory loss, confusion. No homicidal or suicidal ideation.  Heme: Denies bruising, bleeding, and enlarged lymph nodes.  Physical Exam: BP 138/86   Pulse 96   Temp (!) 97.3 F (36.3 C) (Oral)   Ht 5\' 10"  (1.778 m)   Wt 247 lb (112 kg)   BMI 35.44 kg/m  General:   Alert and oriented. No distress noted. Pleasant and cooperative.  Head:  Normocephalic and atraumatic. Eyes:  Conjuctiva clear without scleral icterus. Mouth:  Mask in place Abdomen:  +BS, soft, non-tender and non-distended. No rebound or guarding. No HSM or masses noted. Msk:  Symmetrical without gross deformities.  Normal posture. Extremities:  Without edema. Neurologic:  Alert and  oriented x4 Psych:  Alert and cooperative. Normal mood and affect.  ASSESSMENT/PLAN: Phillip Chen is a 44 y.o. male presenting today with history of chronic GERD, EGD at outside facility earlier this year without complicating features, doing well today in follow-up.  He is currently on omeprazole BID, so we will wean this down to once daily. I have refilled prescription for him today.  Next colonoscopy will be in 2023 due to history of polyps.   Discussed signs/symptoms that would necessitate earlier evaluation, otherwise we will see him in 1 year.   Annitta Needs, PhD, ANP-BC [facility]

## 2020-03-07 ENCOUNTER — Encounter: Payer: Self-pay | Admitting: Gastroenterology

## 2020-03-07 ENCOUNTER — Ambulatory Visit (INDEPENDENT_AMBULATORY_CARE_PROVIDER_SITE_OTHER): Payer: BC Managed Care – PPO | Admitting: Gastroenterology

## 2020-03-07 ENCOUNTER — Other Ambulatory Visit: Payer: Self-pay

## 2020-03-07 VITALS — BP 138/86 | HR 96 | Temp 97.3°F | Ht 70.0 in | Wt 247.0 lb

## 2020-03-07 DIAGNOSIS — K219 Gastro-esophageal reflux disease without esophagitis: Secondary | ICD-10-CM | POA: Diagnosis not present

## 2020-03-07 MED ORDER — OMEPRAZOLE 20 MG PO CPDR
20.0000 mg | DELAYED_RELEASE_CAPSULE | Freq: Two times a day (BID) | ORAL | 3 refills | Status: DC
Start: 2020-03-07 — End: 2021-07-25

## 2020-03-07 NOTE — Patient Instructions (Signed)
I am glad you are doing better from our standpoint!  Let's decrease to once daily omeprazole. You can take it before dinner or before breakfast. I have refilled at twice a day just in case we need to go back to that, but the goal is the lowest dose possible that treats your symptoms.  We will see you back in 1 year or sooner if needed!  I enjoyed seeing you again today! As you know, I value our relationship and want to provide genuine, compassionate, and quality care. I welcome your feedback. If you receive a survey regarding your visit,  I greatly appreciate you taking time to fill this out. See you next time!  Annitta Needs, PhD, ANP-BC [facility]

## 2020-03-23 DIAGNOSIS — M961 Postlaminectomy syndrome, not elsewhere classified: Secondary | ICD-10-CM | POA: Diagnosis not present

## 2020-04-27 DIAGNOSIS — M961 Postlaminectomy syndrome, not elsewhere classified: Secondary | ICD-10-CM | POA: Insufficient documentation

## 2020-04-27 DIAGNOSIS — M5136 Other intervertebral disc degeneration, lumbar region: Secondary | ICD-10-CM | POA: Diagnosis not present

## 2020-06-06 DIAGNOSIS — Z981 Arthrodesis status: Secondary | ICD-10-CM | POA: Diagnosis not present

## 2020-06-13 DIAGNOSIS — M961 Postlaminectomy syndrome, not elsewhere classified: Secondary | ICD-10-CM | POA: Diagnosis not present

## 2020-12-27 DIAGNOSIS — M5416 Radiculopathy, lumbar region: Secondary | ICD-10-CM | POA: Insufficient documentation

## 2021-02-07 ENCOUNTER — Encounter: Payer: Self-pay | Admitting: Internal Medicine

## 2021-02-14 DIAGNOSIS — G894 Chronic pain syndrome: Secondary | ICD-10-CM | POA: Insufficient documentation

## 2021-02-20 DIAGNOSIS — M84369A Stress fracture, unspecified tibia and fibula, initial encounter for fracture: Secondary | ICD-10-CM | POA: Insufficient documentation

## 2021-02-20 DIAGNOSIS — S83249A Other tear of medial meniscus, current injury, unspecified knee, initial encounter: Secondary | ICD-10-CM | POA: Insufficient documentation

## 2021-07-25 ENCOUNTER — Telehealth: Payer: Self-pay

## 2021-07-25 ENCOUNTER — Telehealth: Payer: Self-pay | Admitting: *Deleted

## 2021-07-25 ENCOUNTER — Encounter: Payer: Self-pay | Admitting: Gastroenterology

## 2021-07-25 ENCOUNTER — Telehealth (INDEPENDENT_AMBULATORY_CARE_PROVIDER_SITE_OTHER): Payer: BC Managed Care – PPO | Admitting: Gastroenterology

## 2021-07-25 ENCOUNTER — Other Ambulatory Visit: Payer: Self-pay

## 2021-07-25 DIAGNOSIS — K219 Gastro-esophageal reflux disease without esophagitis: Secondary | ICD-10-CM

## 2021-07-25 DIAGNOSIS — R6881 Early satiety: Secondary | ICD-10-CM

## 2021-07-25 DIAGNOSIS — D124 Benign neoplasm of descending colon: Secondary | ICD-10-CM

## 2021-07-25 MED ORDER — OMEPRAZOLE 20 MG PO CPDR: 20.0000 mg | DELAYED_RELEASE_CAPSULE | Freq: Two times a day (BID) | ORAL | 3 refills | Status: DC

## 2021-07-25 NOTE — Telephone Encounter (Signed)
Shawnie Pons, you are scheduled for a virtual visit with your provider today.  Just as we do with appointments in the office, we must obtain your consent to participate.  Your consent will be active for this visit and any virtual visit you may have with one of our providers in the next 365 days.  If you have a MyChart account, I can also send a copy of this consent to you electronically.  All virtual visits are billed to your insurance company just like a traditional visit in the office.  As this is a virtual visit, video technology does not allow for your provider to perform a traditional examination.  This may limit your provider's ability to fully assess your condition.  If your provider identifies any concerns that need to be evaluated in person or the need to arrange testing such as labs, EKG, etc, we will make arrangements to do so.  Although advances in technology are sophisticated, we cannot ensure that it will always work on either your end or our end.  If the connection with a video visit is poor, we may have to switch to a telephone visit.  With either a video or telephone visit, we are not always able to ensure that we have a secure connection.   I need to obtain your verbal consent now.   Are you willing to proceed with your visit today?  ?

## 2021-07-25 NOTE — Patient Instructions (Signed)
We are arranging a gastric emptying study in the near future! ? ?Further recommendations to follow! ? ?I enjoyed seeing you again today! As you know, I value our relationship and want to provide genuine, compassionate, and quality care. I welcome your feedback. If you receive a survey regarding your visit,  I greatly appreciate you taking time to fill this out. See you next time! ? ?Annitta Needs, PhD, ANP-BC ?[facility]  ? ?

## 2021-07-25 NOTE — Telephone Encounter (Signed)
Pt consented to a virtual visit. 

## 2021-07-25 NOTE — Telephone Encounter (Signed)
[facility] scheduled for 07/30/21 at 10:00am, arrive at 9:45am. NPO after midnight prior to test and no stomach med the morning of test. ? ?Called pt, informed him of [facility] appt. Appt letter also sent to [place]. Had Erline Levine to update insurance in chart. ? ?Per [employer] website for [facility]: The selected exam does not require an Order for this member and/or health plan. ?

## 2021-07-25 NOTE — Progress Notes (Signed)
? ? ? ? ?Primary Care Physician:  Rory Percy, MD  ?Primary GI: Dr Gala Romney  ? ?Patient Location: Home ?  ?Provider Location: [place] office ?  ?Reason for Visit: Early satiety, bloating, foul-smelling burps ?  ?Persons present on the virtual encounter, with roles: Patient and NP, wife ?  ?Total time (minutes) spent on medical discussion: 15 minutes ?  ?Due to COVID-19, visit was conducted using virtual method.  Visit was requested by patient. ? ?Virtual Visit via MyChart Video Note ?Due to COVID-19, visit is conducted virtually and was requested by patient.  ? ?I connected with Phillip Chen on 07/25/21 at  1:00 PM EST by video and verified that I am speaking with the correct person using two identifiers. ?  ?I discussed the limitations, risks, security and privacy concerns of performing an evaluation and management service by video and the availability of in person appointments. I also discussed with the patient that there may be a patient responsible charge related to this service. The patient expressed understanding and agreed to proceed. ? ?Chief Complaint  ?Patient presents with  ? Follow-up  ? ? ? ?History of Present Illness: ? ?46 year old male with history of GERD, outside EGD in Feb 2021 with gastritis, duodenitis, benign fundic gland polyp. Colonoscopy surveillance due Dec 2023.  ? ?Need refill on omeprazole. One a day works well for him. Symptom onset of early satiety, intermittent nausea, bloating, foul-burps for several months. Notices symptoms with larger amounts of meat. BBQ sandwich will worsen symptoms. More discomfort in abdomen. Takes percocet BID. Sometimes bloated with small amounts of food.  ? ? No diarrhea, constipation or changes in bowel habits. No weight loss. No dysphagia. No overt GI Bleeding.  ? ? ?Past Medical History:  ?Diagnosis Date  ? Anxiety   ? Asthma   ? Chronic back pain   ? GERD (gastroesophageal reflux disease)   ? HTN (hypertension)   ? Sleep apnea   ? CPAP  ? ? ? ?Past Surgical  History:  ?Procedure Laterality Date  ? ABDOMINAL EXPOSURE N/A 11/10/2019  ? Procedure: ABDOMINAL EXPOSURE;  Surgeon: Serafina Mitchell, MD;  Location: [place];  Service: Vascular;  Laterality: N/A;  ? ANTERIOR LUMBAR FUSION N/A 11/10/2019  ? Procedure: ANTERIOR LUMBAR FUSION (ALIF) LUMBAR FIVE-SACRAL ONE, POSTERIOR SPINAL FUSION INTERBODY LUMBAR FIVE -SACRAL ONE;  Surgeon: Melina Schools, MD;  Location: [place];  Service: Orthopedics;  Laterality: N/A;  5.5 hrs ?Dr. Trula Slade to do approach ?Left sided tap block with exparel  ? COLONOSCOPY  05/26/2019  ? Dr. Rocco Serene; 2 hyperplastic polyp (0.5 cm and 0.4 cm), 4 fragments of tubular adenoma (0.3-0.6 cm), diverticulosis.  Recommended repeat colonoscopy in 3 years.  ? ESOPHAGOGASTRODUODENOSCOPY  07/12/2019  ? Dr. Rocco Serene; moderate acute gastritis, duodenitis, fundic gland polyp with benign pathology.  CLOtest negative.  Noted bile in his stomach which was seen refluxing into the esophagus and up to the level of his vocal cords.  ? LUMBAR FUSION  10/2019  ? SHOULDER SURGERY Left   ? TONSILLECTOMY    ? ? ? ?Current Meds  ?Medication Sig  ? albuterol (VENTOLIN HFA) 108 (90 Base) MCG/ACT inhaler Inhale into the lungs every 6 (six) hours as needed for wheezing or shortness of breath.  ? DULoxetine (CYMBALTA) 60 MG capsule Take 60 mg by mouth daily.  ? fluticasone-salmeterol (ADVAIR) 100-50 MCG/ACT AEPB Inhale 1 puff into the lungs daily.  ? gabapentin (NEURONTIN) 300 MG capsule Take 600 mg by mouth 2 (two) times  daily.  ? HYDROcodone-acetaminophen (NORCO/VICODIN) 5-325 MG tablet Take 1 tablet by mouth 2 (two) times daily as needed. 1 at bedtime  ? lisinopril-hydrochlorothiazide (ZESTORETIC) 10-12.5 MG tablet Take 1 tablet by mouth daily.   ? meloxicam (MOBIC) 15 MG tablet Take 15 mg by mouth daily.  ? omeprazole (PRILOSEC) 20 MG capsule Take 1 capsule (20 mg total) by mouth 2 (two) times daily before a meal.  ? [DISCONTINUED] omeprazole (PRILOSEC) 20 MG capsule Take 1 capsule  (20 mg total) by mouth 2 (two) times daily before a meal.  ?  ? ?Family History  ?Problem Relation Age of Onset  ? Diverticulitis Father   ? Colon cancer Maternal Uncle   ? ? ?Social History  ? ?Socioeconomic History  ? Marital status: Married  ?  Spouse name: Not on file  ? Number of children: Not on file  ? Years of education: Not on file  ? Highest education level: Not on file  ?Occupational History  ? Not on file  ?Tobacco Use  ? Smoking status: Former  ?  Types: Cigarettes  ? Smokeless tobacco: Never  ?Vaping Use  ? Vaping Use: Never used  ?Substance and Sexual Activity  ? Alcohol use: Yes  ?  Comment: rare-once a month  ? Drug use: Never  ? Sexual activity: Not on file  ?Other Topics Concern  ? Not on file  ?Social History Narrative  ? Not on file  ? ?Social Determinants of Health  ? ?Financial Resource Strain: Not on file  ?Food Insecurity: Not on file  ?Transportation Needs: Not on file  ?Physical Activity: Not on file  ?Stress: Not on file  ?Social Connections: Not on file  ? ? ? ? ? Review of Systems: ?Gen: Denies fever, chills, anorexia. Denies fatigue, weakness, weight loss.  ?CV: Denies chest pain, palpitations, syncope, peripheral edema, and claudication. ?Resp: Denies dyspnea at rest, cough, wheezing, coughing up blood, and pleurisy. ?GI: see HPI ?Derm: Denies rash, itching, dry skin ?Psych: Denies depression, anxiety, memory loss, confusion. No homicidal or suicidal ideation.  ?Heme: Denies bruising, bleeding, and enlarged lymph nodes. ? ?Observations/Objective: ?No distress. Unable to perform physical exam due to video encounter. No video available.  ? ?Assessment and Plan: ?46 year old male with history of GERD, outside EGD in Feb 2021 with gastritis, duodenitis, benign fundic gland polyp. Colonoscopy surveillance due Dec 2023.  ? ?Now with symptoms highly suggestive of gastroparesis in setting of chronic narcotic therapy. No alarm signs/symptoms. Will pursue GES in near future. In meantime, he  should stick with smaller, more frequent meals.  ? ?Continue omeprazole once to BID. Will pursue GES and provide further recommendations thereafter. Colonoscopy due in Dec 2023.  ? ? ?Follow Up Instructions: ? ?  ?I discussed the assessment and treatment plan with the patient. The patient was provided an opportunity to ask questions and all were answered. The patient agreed with the plan and demonstrated an understanding of the instructions. ?  ?The patient was advised to call back or seek an in-person evaluation if the symptoms worsen or if the condition fails to improve as anticipated. ? ?I provided 15 minutes of face-to-face time during this MyChart Video encounter. ? ?Annitta Needs, PhD, ANP-BC ?[facility]  ? ? ? ? ?

## 2021-07-30 ENCOUNTER — Other Ambulatory Visit: Payer: Self-pay

## 2021-07-30 ENCOUNTER — Encounter (HOSPITAL_COMMUNITY)
Admission: RE | Admit: 2021-07-30 | Discharge: 2021-07-30 | Disposition: A | Discharge status: Home / Self Care | Payer: BC Managed Care – PPO | Source: Ambulatory Visit | Attending: Gastroenterology | Admitting: Gastroenterology

## 2021-07-30 DIAGNOSIS — R6881 Early satiety: Secondary | ICD-10-CM | POA: Diagnosis present

## 2021-07-30 DIAGNOSIS — K219 Gastro-esophageal reflux disease without esophagitis: Secondary | ICD-10-CM | POA: Diagnosis present

## 2021-07-30 MED ORDER — SODIUM IODIDE I 131 CAPSULE: 12.8000 | Freq: Once | INTRAVENOUS | Status: DC

## 2021-07-30 MED ORDER — TECHNETIUM TC 99M SULFUR COLLOID
2.0000 | Freq: Once | INTRAVENOUS | Status: AC | PRN
  Administered 2021-07-30: 2.1 via ORAL

## 2021-07-31 IMAGING — CR DG CHEST 2V
2 series · 2 of 2 positions shown · non-contrast
Comparison: None.

CLINICAL DATA: Preoperative examination. Patient for spinal fusion.

EXAM:
CHEST - 2 VIEW

[w chest pa]
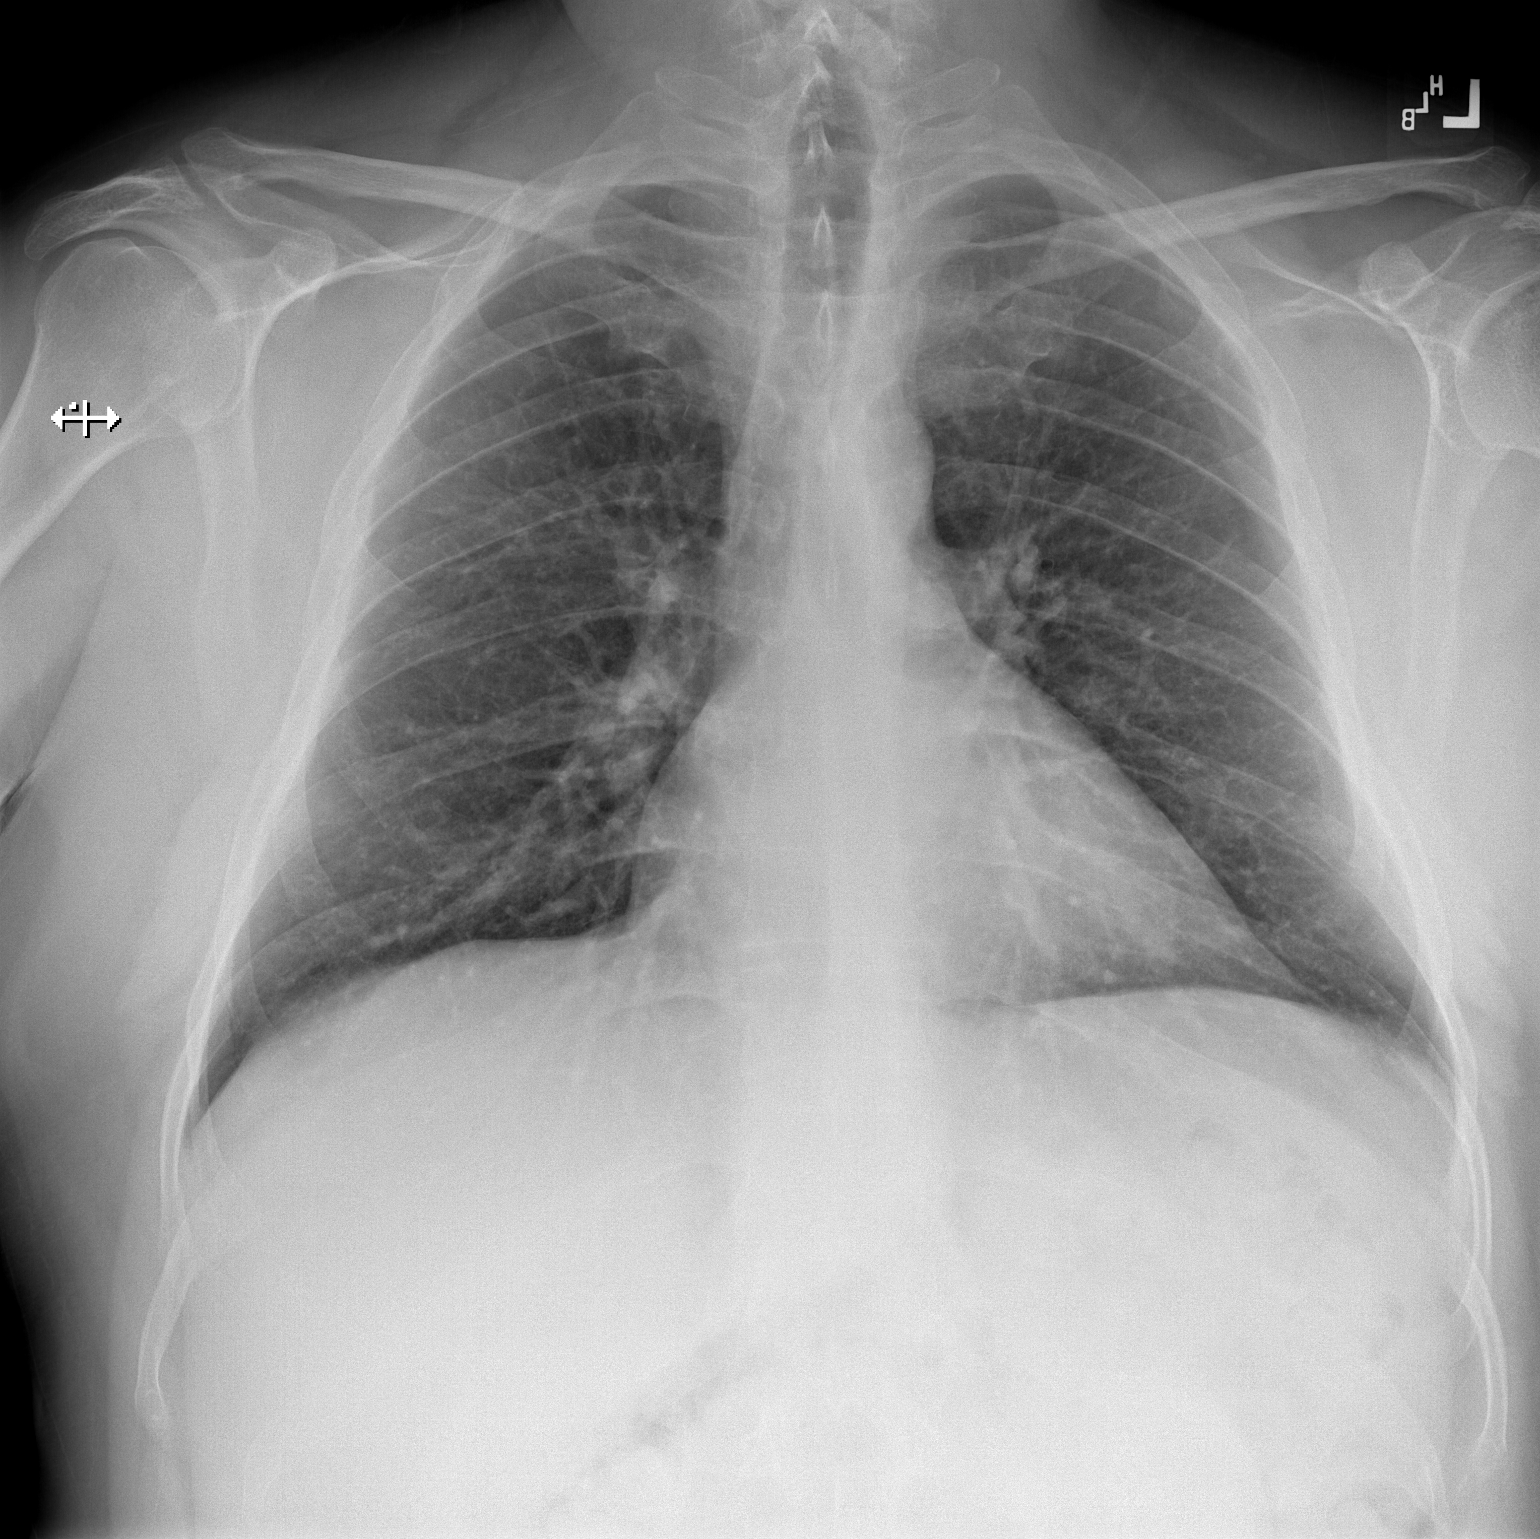

[w chest lat]
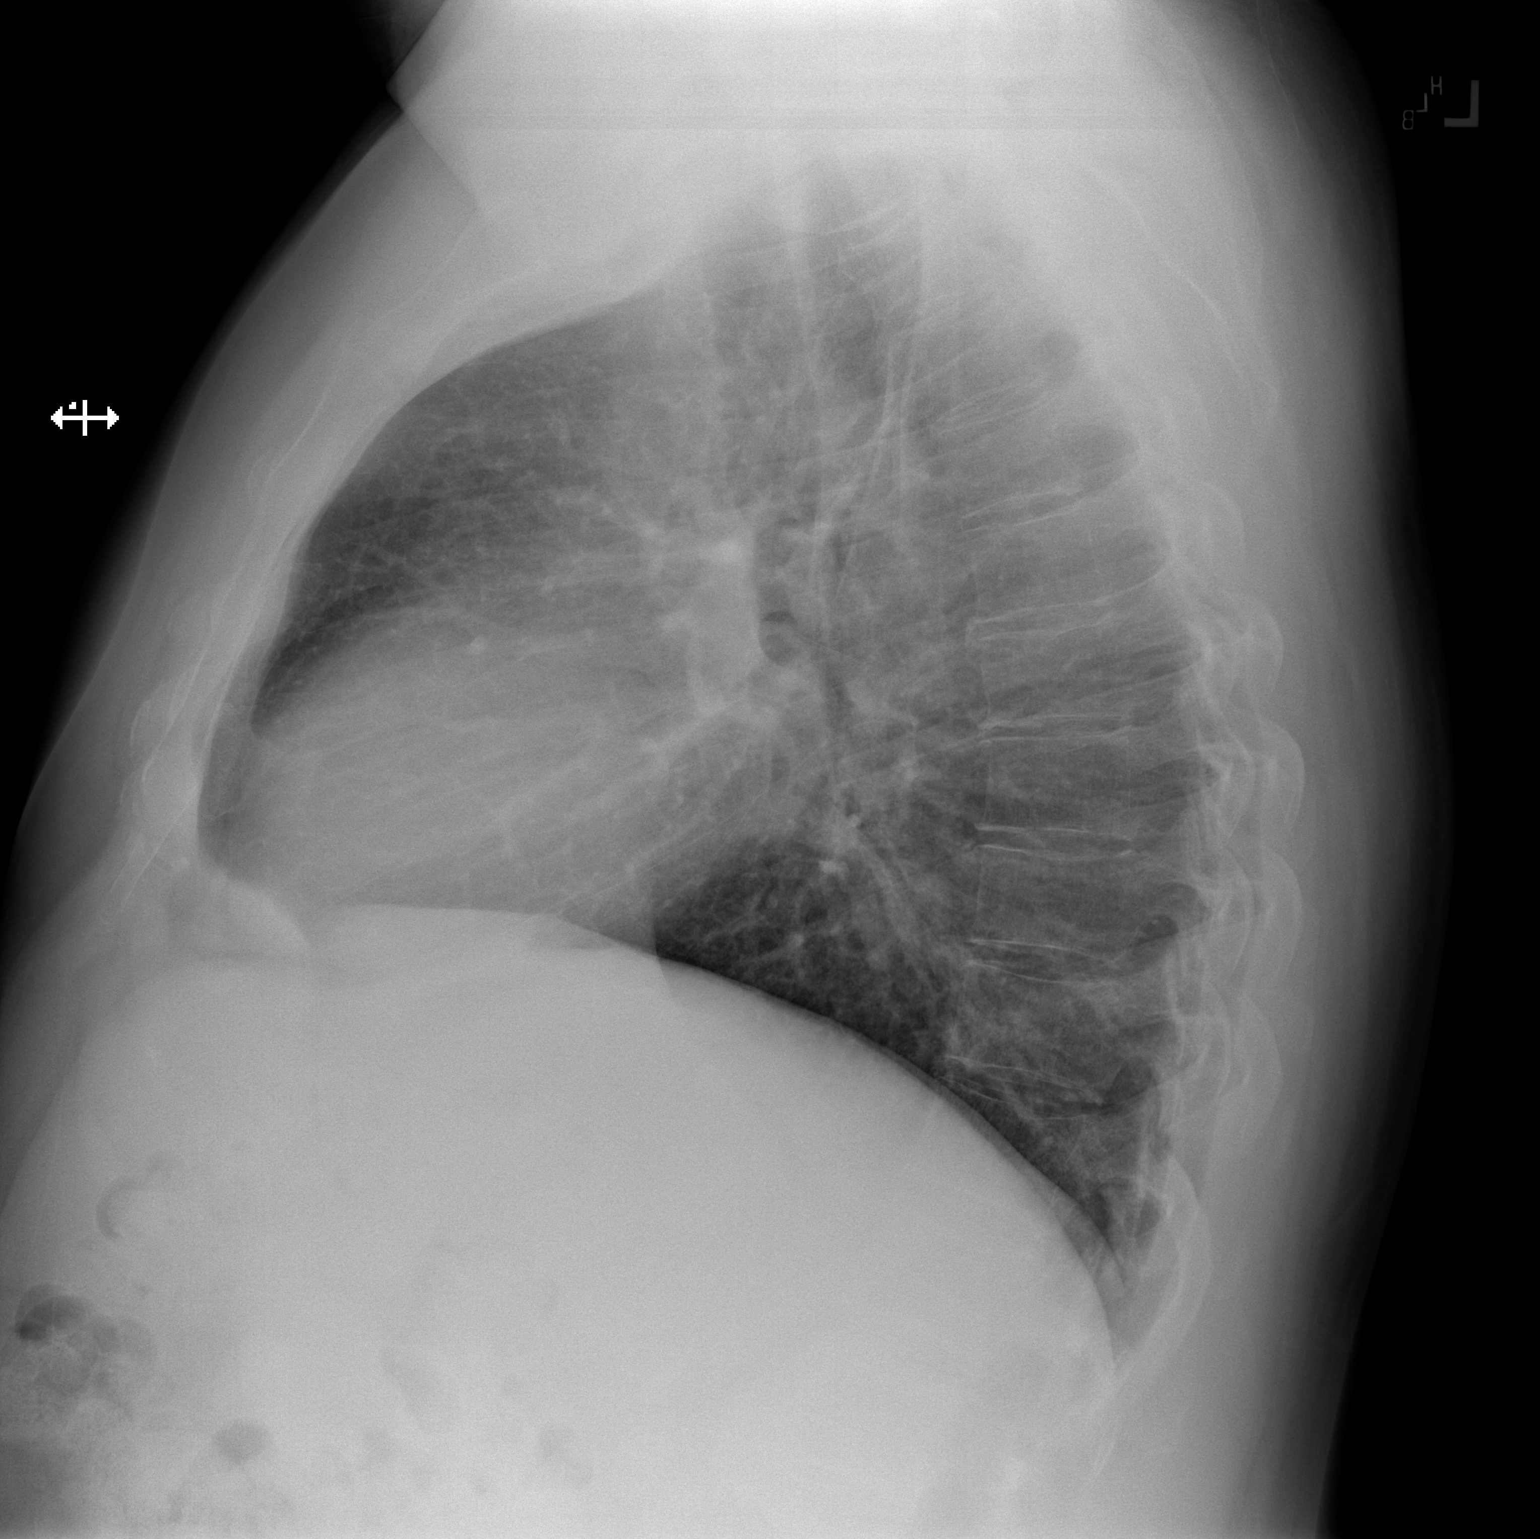

[2 of 2 positions shown; findings below may reference images not displayed]

FINDINGS: Lungs clear. Heart size normal. No pneumothorax or pleural fluid. No
acute or focal bony abnormality.
IMPRESSION: No acute disease.

## 2021-08-06 ENCOUNTER — Telehealth: Payer: Self-pay

## 2021-08-06 NOTE — Telephone Encounter (Signed)
PA done for the pt's Omeprazole 20 mg capsules. Dx used: K21.9 and R68.81.  the pt has tried/failed: Prevacid and Carafate. Waiting on a response from Cover My Meds. ?

## 2021-08-07 NOTE — Telephone Encounter (Signed)
Phoned the pt and advised him that the [facility] we have on file does not have Rx benefits. Advised the pt that this strength of Omeprazole can be brought OTC. Pt advises he will do so ?

## 2021-09-14 DIAGNOSIS — M222X2 Patellofemoral disorders, left knee: Secondary | ICD-10-CM | POA: Insufficient documentation

## 2021-10-11 IMAGING — XA DG MYELOGRAPHY LUMBAR INJ LUMBOSACRAL
14 of 16 series · 14 of 16 positions shown · IV contrast (omnipaque)
Comparison: MR 09/01/2019 and previous

CLINICAL DATA: Previous lumbar fusion surgery.  New back pain.

EXAM:
LUMBAR MYELOGRAM
CT LUMBAR SPINE WITH INTRATHECAL CONTRAST
FLUOROSCOPY TIME:  43 seconds; 371  uOymI DAP
TECHNIQUE: The procedure, risks (including but not limited to bleeding,
infection, organ damage ), benefits, and alternatives were explained
to the patient. Questions regarding the procedure were encouraged
and answered. The patient understands and consents to the procedure.
An appropriate entry site was determined under fluoroscopy. Operator
donned sterile gloves and mask. Skin site was marked, prepped with
Betadine, and draped in usual sterile fashion, and infiltrated
locally with 1% lidocaine. A 22 gauge spinal needle was advanced
into the thecal sac at L3-4 from a left parasagittal approach. Clear
colorless CSF returned. 17 ml Omnipaque 180 were administered
intrathecally for lumbar myelography, followed by axial CT scanning
of the lumbar spine.
I personally performed the lumbar puncture and administered the
intrathecal contrast. I also personally supervised acquisition of
the myelogram images. Coronal and sagittal reconstructions were
generated from the axial scan.

[Series 1: vasc adipose · 1 of 1 slices shown (1 of 11)]
[im 1/1]
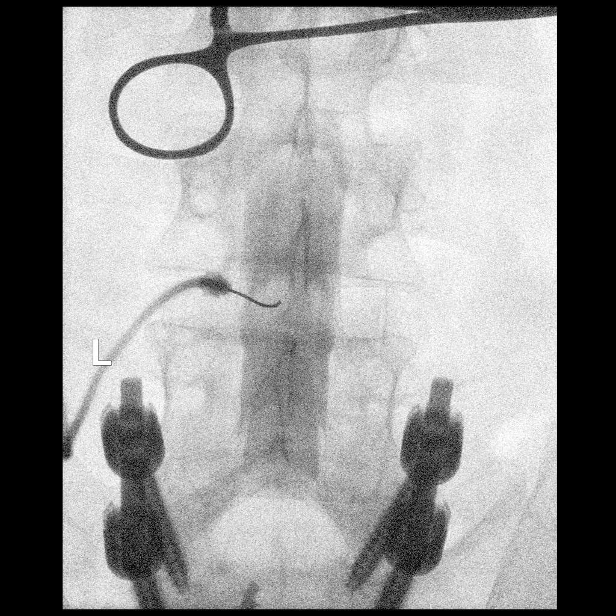

[Series 1: w lumbar spine lat · 0.15mm/px · 1 of 1 slices shown]
[im 1/1]
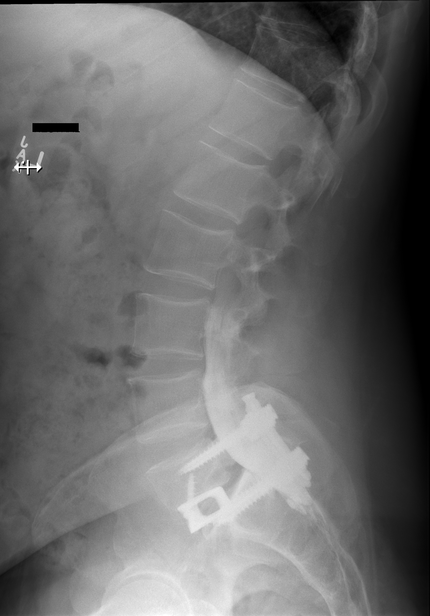

[Series 2: w lumbar spine flexion · 0.15mm/px · 1 of 1 slices shown]
[im 1/1]
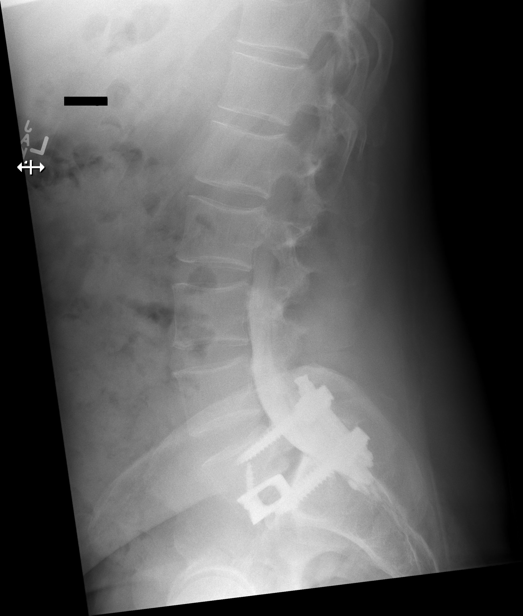

[Series 3: w lumbar spine extension · 0.15mm/px · 1 of 1 slices shown]
[im 1/1]
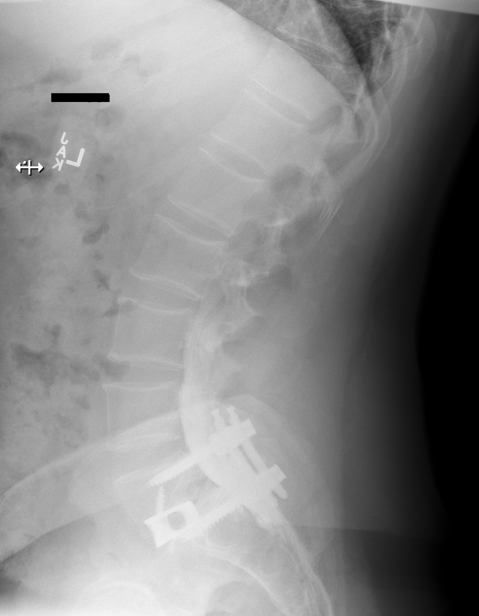

[Series 3: vasc adipose · 1 of 1 slices shown (2 of 11)]
[im 1/1]
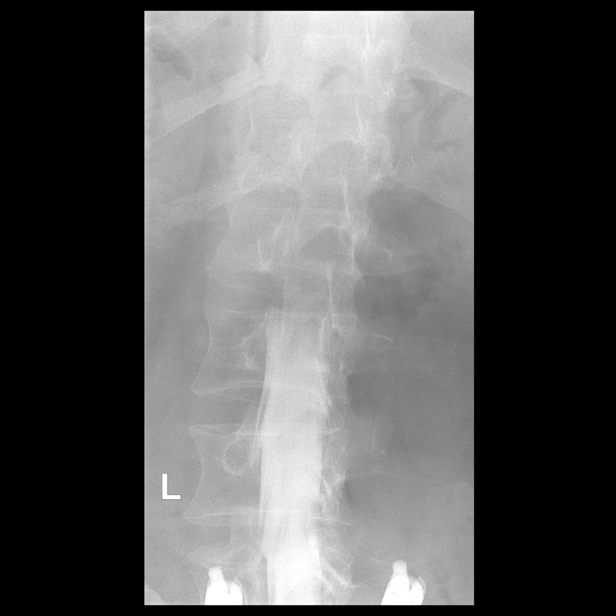

[Series 4: vasc adipose · 1 of 1 slices shown (3 of 11)]
[im 1/1]
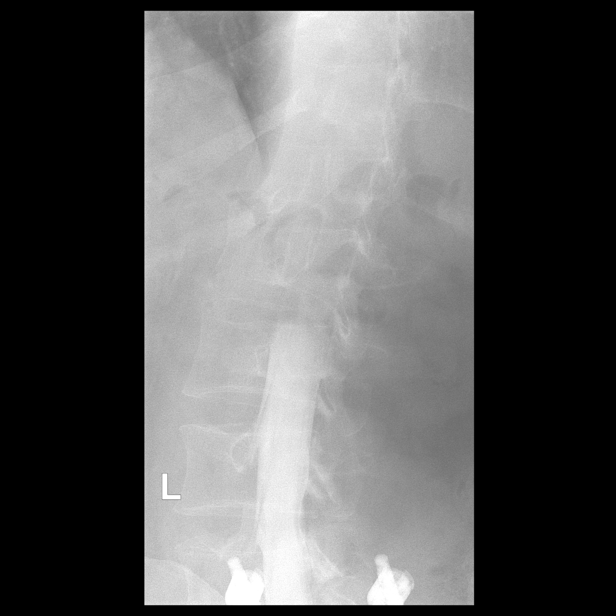

[Series 5: vasc adipose · 1 of 1 slices shown (4 of 11)]
[im 1/1]
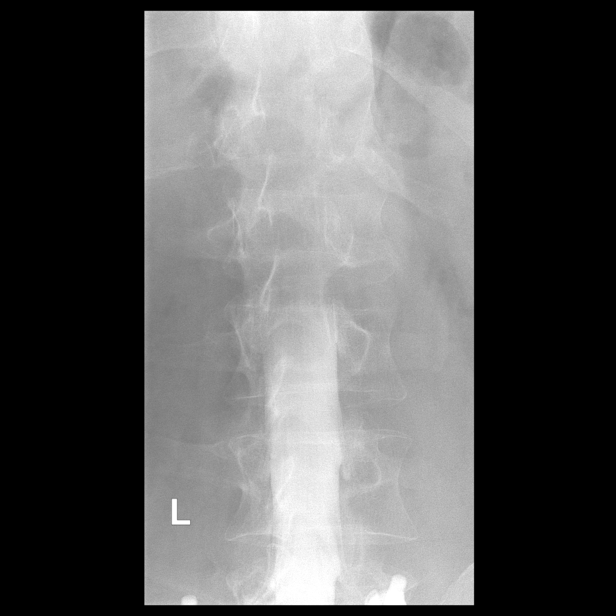

[Series 6: vasc adipose · 1 of 1 slices shown (5 of 11)]
[im 1/1]
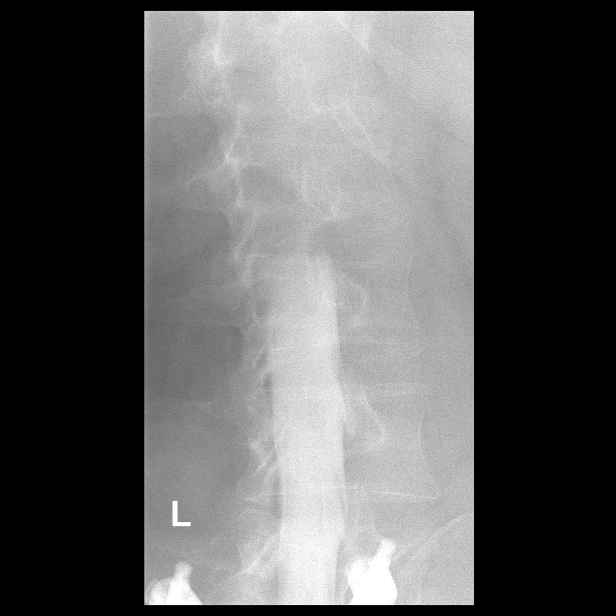

[Series 7: vasc adipose · 1 of 1 slices shown (6 of 11)]
[im 1/1]
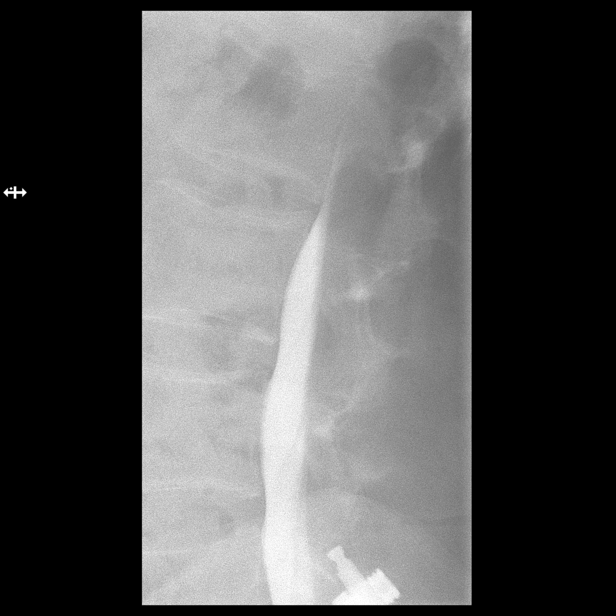

[Series 8: vasc adipose · 1 of 1 slices shown (7 of 11)]
[im 1/1]
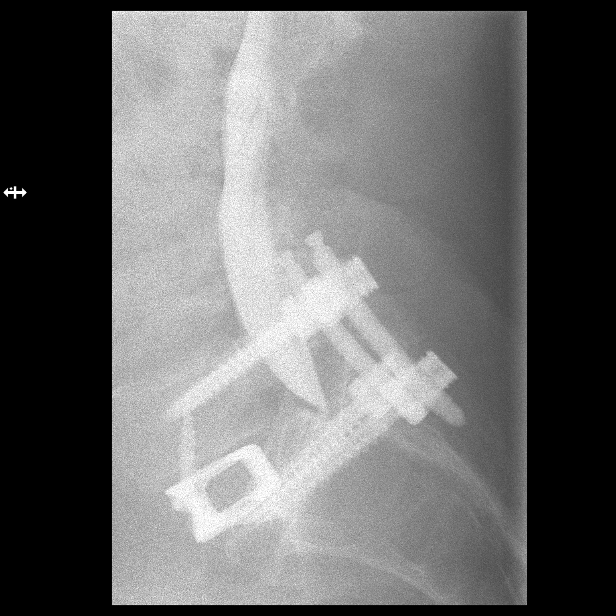

[Series 10: vasc adipose · 1 of 1 slices shown (8 of 11)]
[im 1/1]
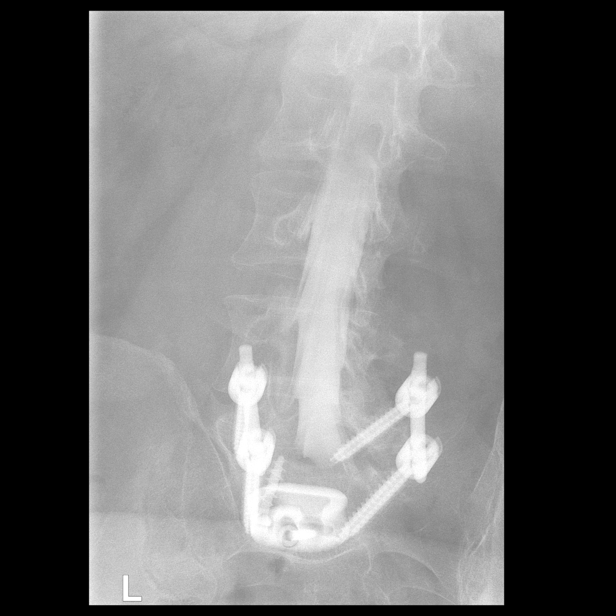

[Series 11: vasc adipose · 1 of 1 slices shown (9 of 11)]
[im 1/1]
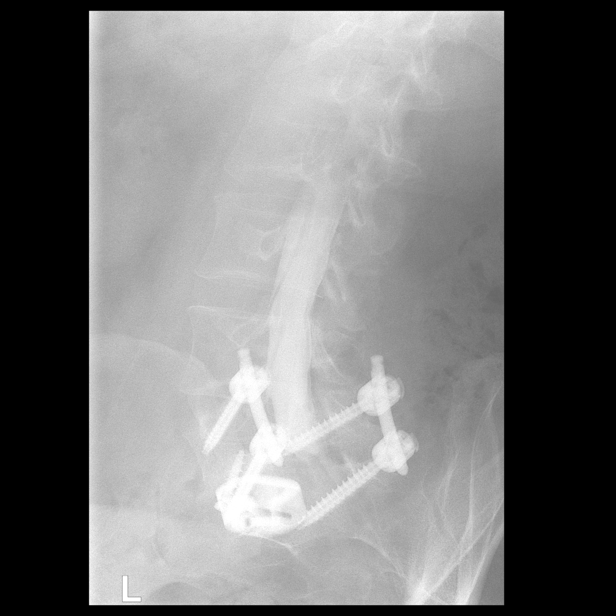

[Series 12: vasc adipose · 1 of 1 slices shown (10 of 11)]
[im 1/1]
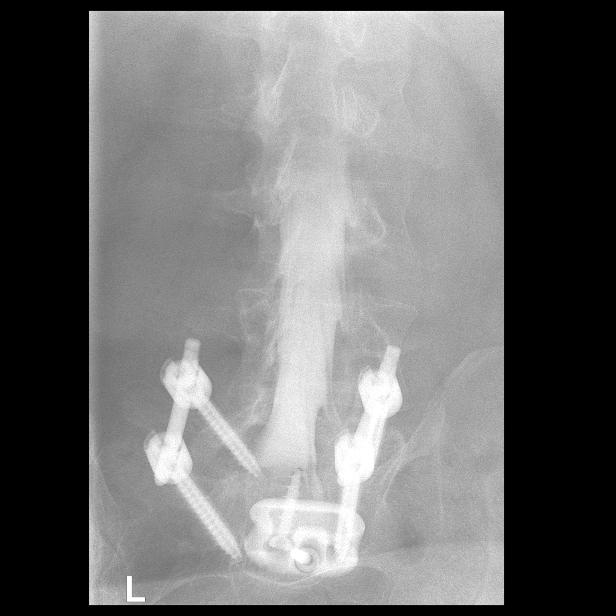

[Series 13: vasc adipose · 1 of 1 slices shown (11 of 11)]
[im 1/1]
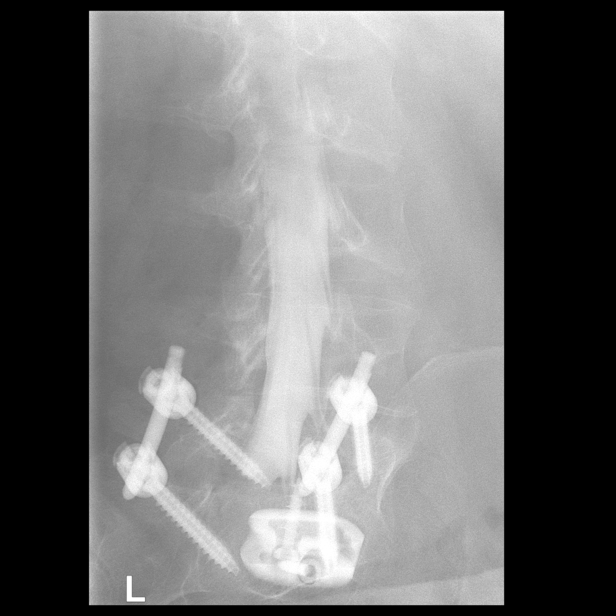

[14 of 16 positions shown; findings below may reference images not displayed]

FINDINGS: 5 non rib-bearing lumbar segments assigned L1-L5 as before. Negative
for fracture.

T12-L1:  Interspace unremarkable.  Normal conus behind L1.

L1-2:  Interspace unremarkable.  Central canal and foramina patent.

L2-3: Interspace unremarkable. Foramina and central canal widely
patent.

L3-4:  Interspace unremarkable.  Central canal and foramina patent.

L4-5: Minimal disc bulge. No spinal stenosis. Mild facet DJD.
Foramina widely patent.

L5-S1: Bilateral pedicle screws at both levels with vertical
interconnecting hardware, intact without surrounding lucency. Stable
grade 1 (8 mm) anterolisthesis. No dynamic instability on standing
lateral flexion/extension radiographs. Interbody fusion hardware
appears well positioned. Bilateral S1 pars defects. No spinal or
foraminal stenosis.

Small left retroperitoneal fluid collection anterior to the
iliopsoas complex measuring approximately 1.9 cm possibly resolving
hematoma or postop seroma. 2 cm low-attenuation lesion lower pole
right kidney, stable since prior CT [DATE]. Remainder of
visualized paraspinal soft tissues unremarkable.
IMPRESSION: 1. PLIF L5-S1 without apparent complication.
2. Minimal disc bulge L4-5 without compressive pathology.

## 2021-10-21 ENCOUNTER — Other Ambulatory Visit: Payer: Self-pay

## 2021-10-21 ENCOUNTER — Emergency Department
Admission: EM | Admit: 2021-10-21 | Discharge: 2021-10-21 | Disposition: A | Discharge status: Home / Self Care | Payer: BC Managed Care – PPO | Attending: Nurse Practitioner | Admitting: Nurse Practitioner

## 2021-10-21 ENCOUNTER — Encounter (HOSPITAL_COMMUNITY): Payer: Self-pay

## 2021-10-21 ENCOUNTER — Emergency Department (HOSPITAL_COMMUNITY): Payer: BC Managed Care – PPO

## 2021-10-21 DIAGNOSIS — W010XXA Fall on same level from slipping, tripping and stumbling without subsequent striking against object, initial encounter: Secondary | ICD-10-CM | POA: Insufficient documentation

## 2021-10-21 DIAGNOSIS — S2231XA Fracture of one rib, right side, initial encounter for closed fracture: Secondary | ICD-10-CM | POA: Insufficient documentation

## 2021-10-21 HISTORY — DX: Depression, unspecified: F32.A

## 2021-10-21 HISTORY — DX: Essential (primary) hypertension: I10

## 2021-10-21 HISTORY — DX: Unspecified asthma, uncomplicated: J45.909

## 2021-10-21 MED ORDER — KETOROLAC 60 MG/2 ML INTRAMUSCULAR SOLUTION
60.0000 mg | INTRAMUSCULAR | Status: AC
Start: 2021-10-21 — End: 2021-10-21
  Administered 2021-10-21: 60 mg via INTRAMUSCULAR

## 2021-10-21 MED ORDER — ONDANSETRON 4 MG DISINTEGRATING TABLET
ORAL_TABLET | ORAL | Status: AC
Start: 2021-10-21 — End: 2021-10-21
  Filled 2021-10-21: qty 1

## 2021-10-21 MED ORDER — ONDANSETRON 4 MG DISINTEGRATING TABLET
4.0000 mg | ORAL_TABLET | ORAL | Status: AC
Start: 2021-10-21 — End: 2021-10-21
  Administered 2021-10-21: 4 mg via ORAL

## 2021-10-21 MED ORDER — IBUPROFEN 800 MG TABLET
800.0000 mg | ORAL_TABLET | Freq: Three times a day (TID) | ORAL | 0 refills | Status: DC | PRN
Start: 2021-10-21 — End: 2023-11-11

## 2021-10-21 MED ORDER — MEPERIDINE (PF) 25 MG/ML INJECTION SOLUTION
INTRAMUSCULAR | Status: AC
Start: 2021-10-21 — End: 2021-10-21
  Filled 2021-10-21: qty 1

## 2021-10-21 MED ORDER — MEPERIDINE (PF) 25 MG/ML INJECTION SOLUTION
25.0000 mg | Freq: Once | INTRAMUSCULAR | Status: DC
Start: 2021-10-21 — End: 2021-10-21

## 2021-10-21 MED ORDER — KETOROLAC 60 MG/2 ML INTRAMUSCULAR SOLUTION
INTRAMUSCULAR | Status: AC
Start: 2021-10-21 — End: 2021-10-21
  Filled 2021-10-21: qty 2

## 2021-10-21 MED ORDER — MEPERIDINE (PF) 25 MG/ML INJECTION SOLUTION
25.0000 mg | Freq: Once | INTRAMUSCULAR | Status: AC
Start: 2021-10-21 — End: 2021-10-21
  Administered 2021-10-21: 25 mg via INTRAMUSCULAR

## 2021-10-21 NOTE — ED Nurses Note (Signed)
Patient to ED6 with complaints of rib pain from a fall from standing 2 days ago. Patient reports pain 10/10 and reports that it hurts when he breathes in and out. Patient placed on BP, family member at bedside, call bell in reach.

## 2021-10-21 NOTE — ED Triage Notes (Addendum)
Fall from standing x2 days ago and landed on a log. Injuring the RT ribs. Denies hitting his head or LOC. Increased pain with breathing and moving. Abrasion and bruising to the right side.

## 2021-10-21 NOTE — ED Nurses Note (Signed)
Patient discharged home with family. AVS reviewed with patient. A written copy of the AVS and discharge instructions were given to the patient. Questions sufficiently answered as needed. Patient encouraged to follow up with PCP as indicated. In the event of an emergency, patient instructed to call 911 or go to the nearest emergency room.

## 2021-10-21 NOTE — Discharge Instructions (Signed)
Drink plenty of water.  Perform deep breathing exercises as tolerated.  Take Motrin 800 mg every 8 hours as needed for pain.  Continue her home pain medications.  Follow-up with your primary care provider in 1-3 days by calling their office within the next 24 hours to arrange an appointment.  Return to the emergency department if worse or as needed.  We hope you feel better.

## 2021-10-21 NOTE — ED Provider Notes (Signed)
[facility]  ED Primary Provider Note  History of Present Illness   Chief Complaint   Patient presents with   . Fall   . Rib Pain     Lance Hughes is a 46 y.o. male who had concerns including Fall and Rib Pain.  Arrival: The patient arrived by Car      This 46 year old male presents to the emergency department complaining of right lower rib pain this started 2 days ago after he slipped and fell.  He describes the pain as sharp in nature and states that it hurts to take a deep breath.  He rates the pain an 8/10.  He denies shortness of breath.  He denies neck pain or head injury.  He denies any other injuries.        Review of Systems   Pertinent positive and negative ROS as per HPI.  Historical Data   History Reviewed This Encounter: Medical History  Surgical History  Family History  Social History      Physical Exam   ED Triage Vitals [10/21/21 0729]   BP (Non-Invasive) (!) 157/92   Heart Rate 81   Respiratory Rate 18   Temperature 36.3 C (97.3 F)   SpO2 95 %   Weight 118 kg (260 lb)   Height 1.778 m ('5\' 10"'$ )     Physical Exam  Vitals and nursing note reviewed.   Constitutional:       General: He is not in acute distress.     Appearance: He is obese. He is not ill-appearing, toxic-appearing or diaphoretic.   HENT:      Head: Normocephalic and atraumatic.      Right Ear: Tympanic membrane, ear canal and external ear normal. There is no impacted cerumen.      Left Ear: Tympanic membrane, ear canal and external ear normal. There is no impacted cerumen.      Nose: Nose normal. No congestion or rhinorrhea.      Mouth/Throat:      Mouth: Mucous membranes are moist.      Pharynx: Oropharynx is clear. No oropharyngeal exudate or posterior oropharyngeal erythema.   Eyes:      Extraocular Movements: Extraocular movements intact.      Conjunctiva/sclera: Conjunctivae normal.      Pupils: Pupils are equal, round, and reactive to light.   Cardiovascular:      Rate and Rhythm: Normal rate and  regular rhythm.      Pulses: Normal pulses.      Heart sounds: Normal heart sounds.   Pulmonary:      Effort: Pulmonary effort is normal. No respiratory distress.      Breath sounds: Normal breath sounds. No stridor. No wheezing, rhonchi or rales.   Chest:      Chest wall: No tenderness.   Abdominal:      General: Abdomen is flat. Bowel sounds are normal. There is no distension.      Palpations: Abdomen is soft. There is no mass.      Tenderness: There is no abdominal tenderness. There is no right CVA tenderness, left CVA tenderness, guarding or rebound.      Hernia: No hernia is present.   Musculoskeletal:         General: Tenderness (Right lower anterior ribs) and signs of injury (Right lower anterior ribs) present. No swelling or deformity. Normal range of motion.      Cervical back: Normal range of motion and neck supple. No rigidity  or tenderness.   Skin:     General: Skin is warm and dry.      Capillary Refill: Capillary refill takes less than 2 seconds.      Findings: Bruising (Right lower anterior chest) present.      Comments: Contusion and various stages of healing to the right lower anterior chest   Neurological:      General: No focal deficit present.      Mental Status: He is alert and oriented to person, place, and time.      Cranial Nerves: No cranial nerve deficit.      Sensory: No sensory deficit.   Psychiatric:         Mood and Affect: Mood normal.         Behavior: Behavior normal.       Patient Data     Labs Ordered/Reviewed - No data to display  XR RIBS RIGHT   Final Result by Edi, Radresults In (05/28 0821)   QUESTIONABLE NONDISPLACED RIGHT EIGHTH RIB FRACTURE. NO PNEUMOTHORAX.               Radiologist location ID: [place] Making          Medical Decision Making  Since the patient has an acute right 8th rib fracture that is nondisplaced he will be treated on outpatient basis with Motrin 800 mg every 8 hours as needed for pain.  He is also instructed to perform  deep breathing exercises as tolerated.  He is advised to follow-up with his primary care provider in 1-3 days or return to the emergency department if worse or as needed.    Closed fracture of one rib of right side, initial encounter: acute illness or injury  Amount and/or Complexity of Data Reviewed  Radiology: ordered and independent interpretation performed. Decision-making details documented in ED Course.     Details: Right rib x-ray series shows a questionable nondisplaced right 8th rib fracture with no pneumothorax as per the radiologist.      Risk  Prescription drug management.  Parenteral controlled substances.        ED Course as of 10/21/21 0845   Sun Oct 21, 2021   0821 Alert x3 and in no acute distress.  States that the pain shot did not help much.   0823 Right rib x-ray series shows a questionable nondisplaced right 8th rib fracture with no pneumothorax as per the radiologist.   [phone] Alert x3 and in no acute distress.  The diagnosis and treatment plan was explained to the patient verbalized understanding of the instructions.         Medications Administered in the ED   ketorolac (TORADOL) '60mg'$ /2 mL IM injection (60 mg IntraMUSCULAR Given 10/21/21 0800)   ondansetron (ZOFRAN ODT) rapid dissolve tablet (4 mg Oral Given 10/21/21 0838)   meperidine (DEMEROL) 25 mg/mL injection (has no administration in time range)     Clinical Impression   Closed fracture of one rib of right side, initial encounter (Primary)       Disposition: Discharged

## 2021-10-25 ENCOUNTER — Other Ambulatory Visit: Payer: Self-pay

## 2021-10-29 ENCOUNTER — Emergency Department (HOSPITAL_COMMUNITY): Payer: BC Managed Care – PPO

## 2021-10-29 ENCOUNTER — Emergency Department
Admission: EM | Admit: 2021-10-29 | Discharge: 2021-10-29 | Disposition: A | Discharge status: Home / Self Care | Payer: BC Managed Care – PPO | Attending: Physician Assistant | Admitting: Physician Assistant

## 2021-10-29 ENCOUNTER — Other Ambulatory Visit: Payer: Self-pay

## 2021-10-29 DIAGNOSIS — R1011 Right upper quadrant pain: Secondary | ICD-10-CM | POA: Insufficient documentation

## 2021-10-29 DIAGNOSIS — K828 Other specified diseases of gallbladder: Secondary | ICD-10-CM | POA: Insufficient documentation

## 2021-10-29 DIAGNOSIS — K760 Fatty (change of) liver, not elsewhere classified: Secondary | ICD-10-CM | POA: Insufficient documentation

## 2021-10-29 LAB — CBC WITH DIFF
BASOPHIL #: 0.1 10*3/uL (ref 0.00–0.30)
BASOPHIL %: 1 % (ref 0–3)
EOSINOPHIL #: 0.3 10*3/uL (ref 0.00–0.80)
EOSINOPHIL %: 4 % (ref 0–7)
HCT: 43.1 % (ref 42.0–51.0)
HGB: 15.2 g/dL (ref 13.5–18.0)
LYMPHOCYTE #: 2.7 10*3/uL (ref 1.10–5.00)
LYMPHOCYTE %: 30 % (ref 25–45)
MCH: 30.9 pg (ref 27.0–32.0)
MCHC: 35.2 g/dL (ref 32.0–36.0)
MCV: 87.7 fL (ref 78.0–99.0)
MONOCYTE #: 0.7 10*3/uL (ref 0.00–1.30)
MONOCYTE %: 8 % (ref 0–12)
MPV: 8.1 fL (ref 7.4–10.4)
NEUTROPHIL #: 5 10*3/uL (ref 1.80–8.40)
NEUTROPHIL %: 56 % (ref 40–76)
PLATELETS: 246 10*3/uL (ref 140–440)
RBC: 4.91 10*6/uL (ref 4.20–6.00)
RDW: 13 % (ref 11.6–14.8)
WBC: 8.8 10*3/uL (ref 4.0–10.5)
WBCS UNCORRECTED: 8.8 10*3/uL

## 2021-10-29 LAB — COMPREHENSIVE METABOLIC PANEL, NON-FASTING
ALBUMIN/GLOBULIN RATIO: 1.4 (ref 0.8–1.4)
ALBUMIN: 4.3 g/dL (ref 3.5–5.7)
ALKALINE PHOSPHATASE: 105 U/L — ABNORMAL HIGH (ref 34–104)
ALT (SGPT): 66 U/L — ABNORMAL HIGH (ref 7–52)
ANION GAP: 8 mmol/L — ABNORMAL LOW (ref 10–20)
AST (SGOT): 44 U/L — ABNORMAL HIGH (ref 13–39)
BILIRUBIN TOTAL: 0.5 mg/dL (ref 0.3–1.2)
BUN/CREA RATIO: 21 (ref 6–22)
BUN: 20 mg/dL (ref 7–25)
CALCIUM, CORRECTED: 9.1 mg/dL (ref 8.9–10.8)
CALCIUM: 9.4 mg/dL (ref 8.6–10.3)
CHLORIDE: 103 mmol/L (ref 98–107)
CO2 TOTAL: 30 mmol/L (ref 21–31)
CREATININE: 0.97 mg/dL (ref 0.60–1.30)
ESTIMATED GFR: 97 mL/min/{1.73_m2} (ref >59)
GLOBULIN: 3 (ref 2.9–5.4)
GLUCOSE: 97 mg/dL (ref 74–109)
OSMOLALITY, CALCULATED: 284 mOsm/kg (ref 270–290)
POTASSIUM: 3.9 mmol/L (ref 3.5–5.1)
PROTEIN TOTAL: 7.3 g/dL (ref 6.4–8.9)
SODIUM: 141 mmol/L (ref 136–145)

## 2021-10-29 LAB — LIPASE: LIPASE: 24 U/L (ref 11–82)

## 2021-10-29 NOTE — ED Triage Notes (Signed)
States ruq abd pain since 1000 today. Denies vomiting or diarrhea.

## 2021-10-29 NOTE — Discharge Instructions (Addendum)
Follow up with your primary care provider in the morning to let them know you were seen in the emergency department, they may want to do further testing. If anything changes, gets worse or does not improve return to the emergency department.

## 2021-10-29 NOTE — ED Triage Notes (Deleted)
States shortness of breath, weakness, black stools and ruq abd pain for approx one week. States she has CHF and cirrhosis of liver.

## 2021-10-29 NOTE — ED Provider Notes (Signed)
[facility]  Emergency Department  Primary Provider Note      CHIEF COMPLAINT  Chief Complaint   Patient presents with   . Abdominal Pain     HISTORY OF PRESENT ILLNESS  Lance Hughes, date of birth Jan 28, 1976, is a 46 y.o. male who presented to the Emergency Department with RUQ pain that occurred around 0900 this morning, a few hours after eating breakfast. He describes the pain as sharp, and it has been intermittent all day. He last ate around 1300. He denies nausea, vomiting, diarrhea, chest pain, SOB, change in bowel movements. He does report h/o fatty liver, and follows with pain management s/p L5-S1 fusion.     PAST MEDICAL/SURGICAL/FAMILY/SOCIAL HISTORY  Past Medical History:   Diagnosis Date   . Asthma    . Depressive disorder    . HTN (hypertension)           Past Surgical History:   Procedure Laterality Date   . SPINAL FUSION  2021    cage and rods       Family Medical History:    None       Social History     Socioeconomic History   . Marital status: Married   Tobacco Use   . Smoking status: Never   . Smokeless tobacco: Never   Substance and Sexual Activity   . Alcohol use: Yes     Comment: occasionally   . Drug use: Never      ALLERGIES  Allergies   Allergen Reactions   . Penicillins      Childhood reaction. Patient is unsure of what the medication does.   . Sulfa (Sulfonamides)      Childhood reaction. Patient is unsure of what the medication does.        PHYSICAL EXAM  VITAL SIGNS:  Filed Vitals:    10/29/21 1803 10/29/21 2000 10/29/21 2100   BP: (!) 151/93 139/70 138/86   Pulse: 95 89    Resp: 19 18    Temp: 36.7 C (98.1 F)     SpO2: 98% 97% 94%       General: No distress.   HENT:  Head: Normocephalic and atraumatic.  Mouth/Throat: Oropharynx is clear and moist. Uvula midline.   Eyes: EOMI, PERRL. normal conjunctiva without injection, lids without swelling or exudate  Ears: Hearing grossly intact.   Neck: Trachea midline. Neck supple.  Cardiovascular: RRR.    Respiratory: CTAB, normal  work of breathing w/o retractions or accessory muscle accessory muscle use  Abdominal: Bowel sounds present and normal. + Murphy sign. Abdomen soft, non-distended, no rebound and no guarding.  Back: No midline spinal tenderness, no paraspinal tenderness, no CVA tenderness.       GU: Deferred  Rectal: Deferred  Musculoskeletal:  Able to ambulate without assistance.  Skin: Warm and dry. No obvious lesion, rash, ulcers, erythema, pallor or cyanosis  Psychiatric: normal mood and affect. Behavior is normal.  Neurological: A&O x3, no acute deficit, focal weakness. Moves all extremities.      DIAGNOSTICS  Labs:  Labs listed below were reviewed and interpreted by me.  Results for orders placed or performed during the hospital encounter of 10/29/21   COMPREHENSIVE METABOLIC PANEL, NON-FASTING   Result Value Ref Range    SODIUM 141 136 - 145 mmol/L    POTASSIUM 3.9 3.5 - 5.1 mmol/L    CHLORIDE 103 98 - 107 mmol/L    CO2 TOTAL 30 21 - 31 mmol/L    ANION  GAP 8 (L) 10 - 20 mmol/L    BUN 20 7 - 25 mg/dL    CREATININE 0.97 0.60 - 1.30 mg/dL    BUN/CREA RATIO 21 6 - 22    ESTIMATED GFR 97 >59 mL/min/1.79m2    ALBUMIN 4.3 3.5 - 5.7 g/dL    CALCIUM 9.4 8.6 - 10.3 mg/dL    GLUCOSE 97 74 - 109 mg/dL    ALKALINE PHOSPHATASE 105 (H) 34 - 104 U/L    ALT (SGPT) 66 (H) 7 - 52 U/L    AST (SGOT) 44 (H) 13 - 39 U/L    BILIRUBIN TOTAL 0.5 0.3 - 1.2 mg/dL    PROTEIN TOTAL 7.3 6.4 - 8.9 g/dL    ALBUMIN/GLOBULIN RATIO 1.4 0.8 - 1.4    OSMOLALITY, CALCULATED 284 270 - 290 mOsm/kg    CALCIUM, CORRECTED 9.1 8.9 - 10.8 mg/dL    GLOBULIN 3.0 2.9 - 5.4   LIPASE   Result Value Ref Range    LIPASE 24 11 - 82 U/L   CBC WITH DIFF   Result Value Ref Range    WBCS UNCORRECTED 8.8 x10^3/uL    WBC 8.8 4.0 - 10.5 x10^3/uL    RBC 4.91 4.20 - 6.00 x10^6/uL    HGB 15.2 13.5 - 18.0 g/dL    HCT 43.1 42.0 - 51.0 %    MCV 87.7 78.0 - 99.0 fL    MCH 30.9 27.0 - 32.0 pg    MCHC 35.2 32.0 - 36.0 g/dL    RDW 13.0 11.6 - 14.8 %    PLATELETS 246 140 - 440 x10^3/uL    MPV  8.1 7.4 - 10.4 fL    NEUTROPHIL % 56 40 - 76 %    LYMPHOCYTE % 30 25 - 45 %    MONOCYTE % 8 0 - 12 %    EOSINOPHIL % 4 0 - 7 %    BASOPHIL % 1 0 - 3 %    NEUTROPHIL # 5.00 1.80 - 8.40 x10^3/uL    LYMPHOCYTE # 2.70 1.10 - 5.00 x10^3/uL    MONOCYTE # 0.70 0.00 - 1.30 x10^3/uL    EOSINOPHIL # 0.30 0.00 - 0.80 x10^3/uL    BASOPHIL # 0.10 0.00 - 0.30 x10^3/uL     Radiology:  Results for orders placed or performed during the hospital encounter of 10/29/21   [place] UPPER QUADRANT     Status: None    Narrative    Marcelis Tiemann    RADIOLOGIST: Jacob Sechrist    [place] UPPER QUAD[place]ANT performed on 10/29/2021 7:20 PM    CLINICAL HISTORY: RUQ abdominal pain.  RUQ PAIN    COMPARISON:  None.    FINDINGS:  Liver: Diffusely echogenic.    Gallbladder:  Stones and sludge filled. Normal gallbladder wall thickness.    Common bile duct:  Normal measuring  3.9 mm.    Pancreas: Obscured by bowel gas.    Visualized portions of the right kidney are unremarkable.  No right upper quadrant ascites.        Impression    Stone and sludge filled gallbladder with no specific evidence for acute cholecystitis    Fatty liver        Radiologist location ID: [id number]    XR CHEST PA AND LATERAL     Status: None    Narrative    Deen Ryer    RADIOLOGIST:Sula Rumple   XR CHEST PA AND LATERAL performed on 10/29/2021 9:15 PM  CLINICAL HISTORY: RUQ pain, broken rib last week.  States ruq abd pain since 1000 today. Denies vomiting or diarrhea. states he cracked his 8th rib on his rt side last week    TECHNIQUE: Frontal and lateral views of the chest.    COMPARISON:  10/13/2021    FINDINGS:    The heart size is normal.  The mediastinal contour is unremarkable.  The lungs are clear. There is no pleural effusion or pneumothorax.  The bones are unremarkable.        Impression    NO ACUTE FINDINGS.      Radiologist location ID: [id number]         ED [place]      ED Course as of 10/29/21 2209   Mon Oct 29, 2021   2030 [place] RT UPPER  QUADRANT  Stone and sludge filled gallbladder with no specific evidence for acute cholecystitis    Fatty liver   2030 ALKALINE PHOSPHATASE(!): 105   2030 ALT (SGPT)(!): 66   2030 AST (SGOT)(!): 44   2124 XR CHEST PA AND LATERAL  The heart size is normal.  The mediastinal contour is unremarkable.  The lungs are clear. There is no pleural effusion or pneumothorax.  The bones are unremarkable.      IMPRESSION:  NO ACUTE FINDINGS.      Medical Decision Making  Gallbladder sludge: acute illness or injury  Right upper quadrant abdominal pain: acute illness or injury  Amount and/or Complexity of Data Reviewed  Labs: ordered.  Radiology: ordered. Decision-making details documented in ED Course.  ECG/medicine tests: independent interpretation performed.        CLINICAL IMPRESSION  Clinical Impression   Right upper quadrant abdominal pain (Primary)   Gallbladder sludge     DISPOSITION  Discharged       DISCHARGE MEDICATIONS  Discharge Medication List as of 10/29/2021  9:31 PM          Nadene Rubins Carmelia Bake   10/29/2021, 20:37   [facility]  Department of Emergency [place]    This note was partially generated using MModal Fluency Direct system, and there may be some incorrect words, spellings, and punctuation that were not noted in checking the note before saving.    -----

## 2021-11-12 ENCOUNTER — Ambulatory Visit: Payer: Self-pay | Admitting: Student

## 2021-11-16 ENCOUNTER — Ambulatory Visit (INDEPENDENT_AMBULATORY_CARE_PROVIDER_SITE_OTHER): Payer: Self-pay | Admitting: Surgery

## 2021-11-16 ENCOUNTER — Encounter (INDEPENDENT_AMBULATORY_CARE_PROVIDER_SITE_OTHER): Payer: Self-pay | Admitting: Surgery

## 2021-11-16 ENCOUNTER — Other Ambulatory Visit: Payer: Self-pay

## 2021-11-16 DIAGNOSIS — R1011 Right upper quadrant pain: Secondary | ICD-10-CM

## 2021-11-16 DIAGNOSIS — K8020 Calculus of gallbladder without cholecystitis without obstruction: Secondary | ICD-10-CM

## 2021-11-16 NOTE — H&P (Signed)
[facility]AL [facility]  [address]  [place] [id number]    History and Physical     Name: Lance Hughes MRN:  [id number]   Date: 11/16/2021 Age: 46 y.o.         H&P    Referring Provider: No ref. provider found     Chief Complaint:  Referral and Cholelithiasis (Pt ref from [facility] for gallstones and sludge )       HPI:  Patient claimed that he fell on a log hitting the right chest and was having some pain and was seen a few days later in the emergency room around May 28th of 2023 and is was found to have a fracture of the right 8th rib without any displacement.  He was seen back in the emergency room about a week later because he felt this was a different kind of pain when he had sonogram showing gallstones.  Patient claimed that he was found to have elevated liver enzymes prior to that in the PCP office when they AST ALT and alk phos were all elevated.  Lipase was normal.  He claims the hepatitis profile was also done was normal however I do not have that report.  At that time GB sonogram was also done and was found to have gallstones but he claimed that he does not have any intolerance to any kind of food there was no nausea or vomiting associated with the pain.  No change in bowel habits.  No history suggestive of jaundice no hematemesis melena or hematochezia.  He does have a history of gastroesophageal reflux disease and has had polypectomy from the colon in the past    ROS  Patient does not have any transient ischemic attacks.  No active chest pains or palpitations.  No cough hemoptysis or dyspnea.  No fever chills or night sweats.  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  There are no urinary complaints.    Detailed history including laboratory studies and the PCP office notes reviewed.  He also had a gastric emptying study in March of this year which was  normal.    Past Medical History:  Past Medical History:   Diagnosis Date   . Anxiety    . Asthma    . Depression    . Depressive disorder    . GERD (gastroesophageal reflux disease)    . HTN (hypertension)    . Sleep apnea          Past Surgical History:   Procedure Laterality Date   . SHOULDER OPEN ROTATOR CUFF REPAIR Left    . SPINAL FUSION  2021    cage and rods         Family Medical History:     Problem Relation (Age of Onset)    Arthritis-rheumatoid Maternal Grandmother    Asthma Maternal Grandmother    COPD Father    Diverticulitis Father    Pancreatic Cancer Paternal Grandfather           Social History     Tobacco Use   . Smoking status: Former     Types: Cigarettes     Quit date: 2002     Years since quitting: 21.4   . Smokeless tobacco: Never   Vaping Use   . Vaping Use: Never used   Substance Use Topics   . Alcohol use: Yes  Comment: occasionally   . Drug use: Never        Allergies:  Allergies   Allergen Reactions   . Penicillins      Childhood reaction. Patient is unsure of what the medication does.  Childhood allergy   . Sulfa (Sulfonamides)      Childhood reaction. Patient is unsure of what the medication does.   Childhood allergy        Medications:  Current Outpatient Medications   Medication Sig   . albuterol sulfate (PROVENTIL OR VENTOLIN OR PROAIR) 90 mcg/actuation Inhalation oral inhaler albuterol sulfate HFA 90 mcg/actuation aerosol inhaler   INHALE 2 PUFFS BY MOUTH EVERY 6 HOURS AS NEEDED   . busPIRone (BUSPAR) 10 mg Oral Tablet buspirone 10 mg tablet   TAKE 1 TABLET BY MOUTH TWICE DAILY   . DULoxetine (CYMBALTA DR) 60 mg Oral Capsule, Delayed Release(E.C.) duloxetine 60 mg capsule,delayed release   TAKE 1 CAPSULE BY MOUTH ONCE DAILY   . fluticasone propion-salmeteroL (ADVAIR) 100-50 mcg/dose Inhalation oral diskus inhaler Advair Diskus 100 mcg-50 mcg/dose powder for inhalation   INHALE 1 PUFF(S) INHALATION TWO TIMES A DAY   . gabapentin (NEURONTIN) 600 mg Oral Tablet gabapentin 600 mg  tablet   TAKE 1 TABLET BY MOUTH TWICE DAILY   . Ibuprofen (MOTRIN) 800 mg Oral Tablet Take 1 Tablet (800 mg total) by mouth Three times a day as needed for Pain   . lisinopriL-hydrochlorothiazide (ZESTORETIC) 20-25 mg Oral Tablet lisinopril 20 mg-hydrochlorothiazide 25 mg tablet   TAKE 1 TABLET BY MOUTH DAILY   . meloxicam (MOBIC) 15 mg Oral Tablet Take 0.5 Tablets (7.5 mg total) by mouth Twice daily   . omeprazole (PRILOSEC) 20 mg Oral Capsule, Delayed Release(E.C.) Take 1 Capsule (20 mg total) by mouth   . oxyCODONE-acetaminophen (PERCOCET) 5-325 mg Oral Tablet Take 1 Tablet by mouth Three times a day as needed        Physical Exam:    Objective:  Vital: BP (!) 150/101 (Site: Left, Patient Position: Sitting, Cuff Size: Adult)   Pulse 76   Resp 18   Ht 1.778 m (_0 )   Wt 121 kg (266 lb)   SpO2 97%   BMI 38.17 kg/m     Patient is fully awake and alert and fully oriented and in no distress.  Blood pressure is elevated.  There is no scleral icterus.    Patient is overweight.    Neck supple  Lungs are clear   Heart sounds normal  Abdomen is soft nontender without any palpable or pulsatile masses.  There is no hepatomegaly.  No hernia.  There is no ankle edema or calf tenderness.  Pedal pulses are palpable.    Laboratory studies imaging studies and other records reviewed.    Assessment and Plan:    ICD-10-CM    1. Cholelithiasis  K80.20       2. Right upper quadrant pain  R10.11         Laparoscopic cholecystectomy was discussed and explained to the patient with the possibility of open procedure that may become necessary during or after the procedure in some cases.  Patient understands and agrees and will be scheduled for mid July  Follow Up:  No follow-ups on file.    Gaspar Garbe, MD

## 2021-11-24 ENCOUNTER — Other Ambulatory Visit: Payer: Self-pay

## 2021-12-04 ENCOUNTER — Other Ambulatory Visit: Payer: Self-pay

## 2021-12-04 ENCOUNTER — Other Ambulatory Visit: Payer: BC Managed Care – PPO | Attending: Surgery

## 2021-12-04 DIAGNOSIS — R1011 Right upper quadrant pain: Secondary | ICD-10-CM | POA: Insufficient documentation

## 2021-12-04 DIAGNOSIS — K8020 Calculus of gallbladder without cholecystitis without obstruction: Secondary | ICD-10-CM | POA: Insufficient documentation

## 2021-12-04 LAB — CBC WITH DIFF
BASOPHIL #: 0.1 10*3/uL (ref 0.00–0.30)
BASOPHIL %: 1 % (ref 0–3)
EOSINOPHIL #: 0.2 10*3/uL (ref 0.00–0.80)
EOSINOPHIL %: 3 % (ref 0–7)
HCT: 45.4 % (ref 42.0–51.0)
HGB: 16 g/dL (ref 13.5–18.0)
LYMPHOCYTE #: 2.4 10*3/uL (ref 1.10–5.00)
LYMPHOCYTE %: 28 % (ref 25–45)
MCH: 31 pg (ref 27.0–32.0)
MCHC: 35.2 g/dL (ref 32.0–36.0)
MCV: 88 fL (ref 78.0–99.0)
MONOCYTE #: 0.4 10*3/uL (ref 0.00–1.30)
MONOCYTE %: 5 % (ref 0–12)
MPV: 8.6 fL (ref 7.4–10.4)
NEUTROPHIL #: 5.4 10*3/uL (ref 1.80–8.40)
NEUTROPHIL %: 63 % (ref 40–76)
PLATELETS: 231 10*3/uL (ref 140–440)
RBC: 5.16 10*6/uL (ref 4.20–6.00)
RDW: 13 % (ref 11.6–14.8)
WBC: 8.5 10*3/uL (ref 4.0–10.5)
WBCS UNCORRECTED: 8.5 10*3/uL

## 2021-12-04 LAB — COMPREHENSIVE METABOLIC PANEL, NON-FASTING
ALBUMIN/GLOBULIN RATIO: 1.5 — ABNORMAL HIGH (ref 0.8–1.4)
ALBUMIN: 4.5 g/dL (ref 3.5–5.7)
ALKALINE PHOSPHATASE: 121 U/L — ABNORMAL HIGH (ref 34–104)
ALT (SGPT): 86 U/L — ABNORMAL HIGH (ref 7–52)
ANION GAP: 9 mmol/L — ABNORMAL LOW (ref 10–20)
AST (SGOT): 49 U/L — ABNORMAL HIGH (ref 13–39)
BILIRUBIN TOTAL: 1.3 mg/dL — ABNORMAL HIGH (ref 0.3–1.2)
BUN/CREA RATIO: 20 (ref 6–22)
BUN: 25 mg/dL (ref 7–25)
CALCIUM, CORRECTED: 8.9 mg/dL (ref 8.9–10.8)
CALCIUM: 9.4 mg/dL (ref 8.6–10.3)
CHLORIDE: 106 mmol/L (ref 98–107)
CO2 TOTAL: 26 mmol/L (ref 21–31)
CREATININE: 1.27 mg/dL (ref 0.60–1.30)
ESTIMATED GFR: 70 mL/min/{1.73_m2} (ref >59)
GLOBULIN: 3 (ref 2.9–5.4)
GLUCOSE: 141 mg/dL — ABNORMAL HIGH (ref 74–109)
OSMOLALITY, CALCULATED: 288 mOsm/kg (ref 270–290)
POTASSIUM: 3.4 mmol/L — ABNORMAL LOW (ref 3.5–5.1)
PROTEIN TOTAL: 7.5 g/dL (ref 6.4–8.9)
SODIUM: 141 mmol/L (ref 136–145)

## 2021-12-05 ENCOUNTER — Other Ambulatory Visit (INDEPENDENT_AMBULATORY_CARE_PROVIDER_SITE_OTHER): Payer: Self-pay

## 2021-12-05 DIAGNOSIS — R748 Abnormal levels of other serum enzymes: Secondary | ICD-10-CM

## 2021-12-06 ENCOUNTER — Ambulatory Visit (HOSPITAL_COMMUNITY): Payer: BC Managed Care – PPO | Admitting: Anesthesiology

## 2021-12-06 ENCOUNTER — Other Ambulatory Visit (HOSPITAL_COMMUNITY): Payer: BC Managed Care – PPO

## 2021-12-06 ENCOUNTER — Inpatient Hospital Stay
Admission: RE | Admit: 2021-12-06 | Discharge: 2021-12-06 | Disposition: A | Discharge status: Home / Self Care | Payer: BC Managed Care – PPO | Source: Ambulatory Visit | Attending: Surgery | Admitting: Surgery

## 2021-12-06 ENCOUNTER — Encounter (HOSPITAL_COMMUNITY)
Admission: RE | Disposition: A | Discharge status: Home / Self Care | Payer: Self-pay | Source: Ambulatory Visit | Attending: Surgery

## 2021-12-06 ENCOUNTER — Encounter (HOSPITAL_COMMUNITY): Payer: Self-pay | Admitting: Surgery

## 2021-12-06 ENCOUNTER — Other Ambulatory Visit: Payer: Self-pay

## 2021-12-06 DIAGNOSIS — K8010 Calculus of gallbladder with chronic cholecystitis without obstruction: Secondary | ICD-10-CM | POA: Insufficient documentation

## 2021-12-06 HISTORY — PX: CHOLECYSTECTOMY: SHX55

## 2021-12-06 LAB — ECG 12 LEAD
Atrial Rate: 69 {beats}/min
Calculated P Axis: 65 degrees
Calculated R Axis: 67 degrees
Calculated T Axis: 12 degrees
PR Interval: 146 ms
QRS Duration: 110 ms
QT Interval: 428 ms
QTC Calculation: 458 ms
Ventricular rate: 69 {beats}/min

## 2021-12-06 SURGERY — CHOLECYSTECTOMY LAPAROSCOPIC
Anesthesia: General | Site: Abdomen | Wound class: Clean Wound: Uninfected operative wounds in which no inflammation occurred

## 2021-12-06 MED ORDER — MORPHINE 4 MG/ML INJECTION WRAPPER
4.0000 mg | INJECTION | INTRAMUSCULAR | Status: DC | PRN
Start: 2021-12-06 — End: 2021-12-06

## 2021-12-06 MED ORDER — ONDANSETRON HCL (PF) 4 MG/2 ML INJECTION SOLUTION
4.0000 mg | Freq: Once | INTRAMUSCULAR | Status: DC | PRN
Start: 2021-12-06 — End: 2021-12-06

## 2021-12-06 MED ORDER — SODIUM CHLORIDE 0.9 % (FLUSH) INJECTION SYRINGE
3.0000 mL | INJECTION | INTRAMUSCULAR | Status: DC | PRN
Start: 2021-12-06 — End: 2021-12-06

## 2021-12-06 MED ORDER — SIMETHICONE 80 MG CHEWABLE TABLET
80.0000 mg | CHEWABLE_TABLET | Freq: Once | ORAL | Status: AC
Start: 2021-12-06 — End: 2021-12-06
  Administered 2021-12-06: 80 mg via ORAL
  Filled 2021-12-06: qty 1

## 2021-12-06 MED ORDER — ROCURONIUM 10 MG/ML INTRAVENOUS SOLUTION
Freq: Once | INTRAVENOUS | Status: DC | PRN
Start: 2021-12-06 — End: 2021-12-06
  Administered 2021-12-06: 10 mg via INTRAVENOUS
  Administered 2021-12-06: 40 mg via INTRAVENOUS

## 2021-12-06 MED ORDER — SODIUM CHLORIDE 0.9 % (FLUSH) INJECTION SYRINGE
3.0000 mL | INJECTION | Freq: Three times a day (TID) | INTRAMUSCULAR | Status: DC
Start: 2021-12-06 — End: 2021-12-06

## 2021-12-06 MED ORDER — FENTANYL (PF) 50 MCG/ML INJECTION WRAPPER
INJECTION | Freq: Once | INTRAMUSCULAR | Status: DC | PRN
Start: 2021-12-06 — End: 2021-12-06
  Administered 2021-12-06 (×2): 50 ug via INTRAVENOUS
  Administered 2021-12-06: 25 ug via INTRAVENOUS
  Administered 2021-12-06: 50 ug via INTRAVENOUS
  Administered 2021-12-06: 25 ug via INTRAVENOUS

## 2021-12-06 MED ORDER — DEXAMETHASONE SODIUM PHOSPHATE 4 MG/ML INJECTION SOLUTION
INTRAMUSCULAR | Status: AC
Start: 2021-12-06 — End: 2021-12-06
  Filled 2021-12-06: qty 1

## 2021-12-06 MED ORDER — ONDANSETRON HCL (PF) 4 MG/2 ML INJECTION SOLUTION
4.0000 mg | Freq: Four times a day (QID) | INTRAMUSCULAR | Status: DC | PRN
Start: 2021-12-06 — End: 2021-12-06

## 2021-12-06 MED ORDER — ONDANSETRON HCL (PF) 4 MG/2 ML INJECTION SOLUTION
4.0000 mg | Freq: Once | INTRAMUSCULAR | Status: AC
Start: 2021-12-06 — End: 2021-12-06
  Administered 2021-12-06: 4 mg via INTRAVENOUS

## 2021-12-06 MED ORDER — ONDANSETRON 4 MG DISINTEGRATING TABLET
4.0000 mg | ORAL_TABLET | Freq: Four times a day (QID) | ORAL | Status: DC | PRN
Start: 2021-12-06 — End: 2021-12-06
  Administered 2021-12-06: 4 mg via ORAL
  Filled 2021-12-06: qty 1

## 2021-12-06 MED ORDER — LACTATED RINGERS INTRAVENOUS SOLUTION
INTRAVENOUS | Status: DC
Start: 2021-12-06 — End: 2021-12-06

## 2021-12-06 MED ORDER — FENTANYL (PF) 50 MCG/ML INJECTION SOLUTION
INTRAMUSCULAR | Status: AC
Start: 2021-12-06 — End: 2021-12-06
  Filled 2021-12-06: qty 2

## 2021-12-06 MED ORDER — SUCCINYLCHOLINE 20 MG/ML INTRAVENOUS WRAPPER
INJECTION | Freq: Once | INTRAVENOUS | Status: DC | PRN
Start: 2021-12-06 — End: 2021-12-06
  Administered 2021-12-06: 160 mg via INTRAVENOUS

## 2021-12-06 MED ORDER — HYDROCODONE 5 MG-ACETAMINOPHEN 325 MG TABLET
1.0000 | ORAL_TABLET | ORAL | Status: DC | PRN
Start: 2021-12-06 — End: 2021-12-06
  Administered 2021-12-06: 1 via ORAL
  Filled 2021-12-06: qty 1

## 2021-12-06 MED ORDER — FENTANYL (PF) 50 MCG/ML INJECTION WRAPPER
50.0000 ug | INJECTION | INTRAMUSCULAR | Status: DC | PRN
Start: 2021-12-06 — End: 2021-12-06
  Administered 2021-12-06 (×2): 50 ug via INTRAVENOUS

## 2021-12-06 MED ORDER — CEFAZOLIN 1 GRAM SOLUTION FOR INJECTION
INTRAMUSCULAR | Status: AC
Start: 2021-12-06 — End: 2021-12-06
  Filled 2021-12-06: qty 20

## 2021-12-06 MED ORDER — ACETAMINOPHEN 325 MG TABLET
650.0000 mg | ORAL_TABLET | ORAL | Status: DC | PRN
Start: 2021-12-06 — End: 2021-12-06

## 2021-12-06 MED ORDER — IPRATROPIUM 0.5 MG-ALBUTEROL 3 MG (2.5 MG BASE)/3 ML NEBULIZATION SOLN
3.0000 mL | INHALATION_SOLUTION | Freq: Once | RESPIRATORY_TRACT | Status: DC | PRN
Start: 2021-12-06 — End: 2021-12-06

## 2021-12-06 MED ORDER — FAMOTIDINE (PF) 20 MG/2 ML INTRAVENOUS SOLUTION
INTRAVENOUS | Status: AC
Start: 2021-12-06 — End: 2021-12-06
  Filled 2021-12-06: qty 2

## 2021-12-06 MED ORDER — DEXMEDETOMIDINE 100 MCG/ML INTRAVENOUS SOLUTION
Freq: Once | INTRAVENOUS | Status: DC | PRN
Start: 2021-12-06 — End: 2021-12-06
  Administered 2021-12-06 (×2): 10 ug via INTRAVENOUS

## 2021-12-06 MED ORDER — EPHEDRINE SULFATE 50 MG/ML INTRAVENOUS SOLUTION
Freq: Once | INTRAVENOUS | Status: DC | PRN
Start: 2021-12-06 — End: 2021-12-06
  Administered 2021-12-06: 10 mg via INTRAVENOUS

## 2021-12-06 MED ORDER — PROCHLORPERAZINE EDISYLATE 10 MG/2 ML (5 MG/ML) INJECTION SOLUTION
5.0000 mg | Freq: Once | INTRAMUSCULAR | Status: DC | PRN
Start: 2021-12-06 — End: 2021-12-06

## 2021-12-06 MED ORDER — LIDOCAINE (PF) 100 MG/5 ML (2 %) INTRAVENOUS SYRINGE
INJECTION | Freq: Once | INTRAVENOUS | Status: DC | PRN
Start: 2021-12-06 — End: 2021-12-06
  Administered 2021-12-06: 100 mg via INTRAVENOUS

## 2021-12-06 MED ORDER — ONDANSETRON HCL (PF) 4 MG/2 ML INJECTION SOLUTION
INTRAMUSCULAR | Status: AC
Start: 2021-12-06 — End: 2021-12-06
  Filled 2021-12-06: qty 2

## 2021-12-06 MED ORDER — SODIUM CHLORIDE 0.9 % INTRAVENOUS PIGGYBACK
INJECTION | INTRAVENOUS | Status: AC
Start: 2021-12-06 — End: 2021-12-06
  Filled 2021-12-06: qty 100

## 2021-12-06 MED ORDER — CEFAZOLIN 1 GRAM SOLUTION FOR INJECTION
Freq: Once | INTRAMUSCULAR | Status: DC | PRN
Start: 2021-12-06 — End: 2021-12-06
  Administered 2021-12-06: 2000 mg via INTRAVENOUS

## 2021-12-06 MED ORDER — DEXMEDETOMIDINE 100 MCG/ML INTRAVENOUS SOLUTION
INTRAVENOUS | Status: AC
Start: 2021-12-06 — End: 2021-12-06
  Filled 2021-12-06: qty 2

## 2021-12-06 MED ORDER — SUGAMMADEX 100 MG/ML INTRAVENOUS SOLUTION
Freq: Once | INTRAVENOUS | Status: DC | PRN
Start: 2021-12-06 — End: 2021-12-06
  Administered 2021-12-06: 400 mg via INTRAVENOUS

## 2021-12-06 MED ORDER — ALBUTEROL SULFATE 2.5 MG/3 ML (0.083 %) SOLUTION FOR NEBULIZATION
2.5000 mg | INHALATION_SOLUTION | Freq: Once | RESPIRATORY_TRACT | Status: DC | PRN
Start: 2021-12-06 — End: 2021-12-06

## 2021-12-06 MED ORDER — MIDAZOLAM 5 MG/ML INJECTION WRAPPER
1.0000 mg | Freq: Once | INTRAMUSCULAR | Status: DC | PRN
Start: 2021-12-06 — End: 2021-12-06
  Administered 2021-12-06: 1 mg via INTRAVENOUS

## 2021-12-06 MED ORDER — FENTANYL (PF) 50 MCG/ML INJECTION WRAPPER
25.0000 ug | INJECTION | INTRAMUSCULAR | Status: DC | PRN
Start: 2021-12-06 — End: 2021-12-06
  Administered 2021-12-06 (×2): 25 ug via INTRAVENOUS

## 2021-12-06 MED ORDER — MIDAZOLAM 5 MG/ML INJECTION WRAPPER
INTRAMUSCULAR | Status: AC
Start: 2021-12-06 — End: 2021-12-06
  Filled 2021-12-06: qty 1

## 2021-12-06 MED ORDER — FAMOTIDINE (PF) 20 MG/2 ML INTRAVENOUS SOLUTION
20.0000 mg | Freq: Once | INTRAVENOUS | Status: AC
Start: 2021-12-06 — End: 2021-12-06
  Administered 2021-12-06: 20 mg via INTRAVENOUS

## 2021-12-06 MED ORDER — PROPOFOL 10 MG/ML IV BOLUS
INJECTION | Freq: Once | INTRAVENOUS | Status: DC | PRN
Start: 2021-12-06 — End: 2021-12-06
  Administered 2021-12-06: 200 mg via INTRAVENOUS

## 2021-12-06 MED ORDER — DEXAMETHASONE SODIUM PHOSPHATE 4 MG/ML INJECTION SOLUTION
4.0000 mg | Freq: Once | INTRAMUSCULAR | Status: AC
Start: 2021-12-06 — End: 2021-12-06
  Administered 2021-12-06: 4 mg via INTRAVENOUS

## 2021-12-06 SURGICAL SUPPLY — 74 items
APPLIER E-CLP2 SUP INTLK 29CM 10MM PSTL GRIP GLARE RST SAF INTLK HNDL 20 ML CLIP TI INTERNAL CLIP (WOUND CARE SUPPLY) ×1 IMPLANT
APPLIER E-CLP2 SUP INTLK 29CM_10MM PSTL GRIP GLARE RST SAF (WOUND CARE/ENTEROSTOMAL SUPPLY) ×1
BLADE 15 2 END CBNSTL SURG STRL DISP (CUTTING ELEMENTS) ×1
BLADE 15 2 END CBNSTL SURG STRL DISP (SURGICAL CUTTING SUPPLIES) ×1 IMPLANT
CATH CHOLANG REDDICK 4FR SCP TIP STRL LTX DISP (VASCULAR) ×1 IMPLANT
CATH CHOLGM SMTR REDDICK 4FR 5_0CM SCP TIP STF TUBE DISP (VASCULAR) ×1
CLEANER INSTR PREPZYME MUL-TRD CONTAINR NARSL NEUT PH BDGR (MISCELLANEOUS PT CARE ITEMS) ×1
CONTAINR HISTO C90ML 10% NEUT BF FRMLN POLYPROP PREFL 60ML (MISCELLANEOUS PT CARE ITEMS) ×2 IMPLANT
CONV USE ITEM 321852 - GLOVE SURG 6.5 LF  PLISPRN (GLOVES AND ACCESSORIES) ×1 IMPLANT
CONV USE ITEM 321854 - GLOVE SURG 6 LF  BEAD CUF SMOOTH HI GRIP WHT 12IN MDCHC PLISPRN (GLOVES AND ACCESSORIES) ×1 IMPLANT
CONV USE ITEM 323185 - PAD EG 15SQ IN UNIV FOAM SPLT NONCORD ADULT 9100 SER (SURGICAL CUTTING SUPPLIES) ×1 IMPLANT
CONV USE ITEM 329146 - CLEANER INSTR PREPZYME MUL-TRD CONTAINR NARSL NEUT PH BDGR 22OZ (MISCELLANEOUS PT CARE ITEMS) ×1 IMPLANT
CONV USE ITEM 49641 - TROCAR LAPSCP 100MM 5MM KII Z THREAD SLEEVE SHIELD BLADE STRL LF  ACCESS SYS ABDOMINAL (ENDOSCOPIC SUPPLIES) ×1 IMPLANT
CONV USE ITEM 81996 - SLEEVE LAPSCP 5MM OPTC ACCESS SYS 100MM KII ABDOMINAL Z THREAD CANN SEAL STRL LF (ENDOSCOPIC SUPPLIES) ×2 IMPLANT
CONV USE ITEM 91385 - SUTURE 0 GS-22 POLYSRB 30IN VIOL BRD COAT ABS (SUTURE/WOUND CLOSURE) ×1 IMPLANT
COUNTER 20 CNT BLOCK ADH NEEDLE STRL LF  RD SHARP FOAM 15.75X11.5X14IN DISP (MED SURG SUPPLIES) ×1 IMPLANT
COUNTER 20 CNT BLOCK ADH NEEDLE STRL LF RD SHARP FOAM 15.75 (MED SURG SUPPLIES) ×2
COVER 53X24IN MAYOSTAND PRXM STRL DISP EQP SMS LF (DRAPE/PACKS/SHEETS/OR TOWEL) ×1 IMPLANT
COVER TBL 90X50IN STD SMS REINF FNFLD STRL LF  DISP (DRAPE/PACKS/SHEETS/OR TOWEL) ×2 IMPLANT
COVER TBL 90X50IN STD SMS REINF FNFLD STRL LF DISP (DRAPE/PACKS/SHEETS/OR TOWEL) ×2
DEVICE SPEC RETR INZII 10MM GUIDE BEAD STD ENDOS 225ML LF (ENDOSCOPIC SUPPLIES) ×1 IMPLANT
DEVICE SPEC RETR INZII 10MM GU_IDE BEAD STD ENDOS 225ML LF (INSTRUMENTS ENDOMECHANICAL) ×1
DISCONTINUED USE ITEM 339356 - SHEARS ESURG 36CM 5MM HARMON ACE+7 CURVE 3 HNDCNTL BUTTON STRL LF  DISP (ENDOSCOPIC SUPPLIES) ×1 IMPLANT
DRAPE ABS FENESTRATE ADH 121X102X77IN ABDOMINAL 12IN 13IN PRXM LF  STRL DISP SURG SMS 20X36IN (DRAPE/PACKS/SHEETS/OR TOWEL) ×1 IMPLANT
DRAPE ABS FENESTRATE ADH 121X1_02X77IN ABDOMINAL 12IN 13IN (DRAPE/PACKS/SHEETS/OR TOWEL) ×1
DRAPE MAYOSTAND CVR 53X24IN PR_XM LF STRL DISP EQP SMS (DRAPE/PACKS/SHEETS/OR TOWEL) ×2
DRESS SECURE 2.75X2.5IN TGDRM IV FILM TAPE STRP STRL LF (IV TUBING & ACCESSORIES) ×4 IMPLANT
DRESSING TEGADERM IV TRNSPR_1683 100/BX (IV TUBING & ACCESSORIES) ×4
GLOVE SURG 6 LF PF SMOOTH STRL WHT PLISPRN (GLOVES AND ACCESSORIES) ×1
GLOVE SURG 6.5 LF PF SMOOTH STRL WHT PLISPRN (GLOVES AND ACCESSORIES) ×1
GOWN SURG LRG STD LGTH REG L3 NONREINFORCE BRTHBL TWL STRL (DRAPE/PACKS/SHEETS/OR TOWEL) ×2
GOWN SURG LRG STD LGTH REG L3 NONREINFORCE BRTHBL TWL STRL LF  DISP BLU HALYARD SPECTRUM SMS (DRAPE/PACKS/SHEETS/OR TOWEL) ×2 IMPLANT
GOWN SURG XL STD LGTH L3 NONREINFORCE HKLP CLSR TWL STRL LF (DRAPE/PACKS/SHEETS/OR TOWEL) ×2
GOWN SURG XL STD LGTH L3 NONREINFORCE HKLP CLSR TWL STRL LF  DISP BLU SPECTRUM SMS (DRAPE/PACKS/SHEETS/OR TOWEL) ×2
GOWN SURG XL STD LGTH L3 NONREINFORCE HKLP CLSR TWL STRL LF DISP BLU SPECTRUM SMS (DRAPE/PACKS/SHEETS/OR TOWEL) ×2 IMPLANT
HDPE THK22 UM C40-45 GL L48 IN X W40 IN NATURAL (MISCELLANEOUS PT CARE ITEMS) ×2 IMPLANT
IRR SUCT 10FT STRKFL TUBE 2 SPIKE STRL LF  DISP (ENDOSCOPIC SUPPLIES) ×1 IMPLANT
IRR SUCT 10FT STRKFL TUBE 2 SPIKE STRL LF DISP (INSTRUMENTS ENDOMECHANICAL) ×1
LABEL MED CORRECT MED LABELING SYS 4 FLG 2 SHEET 24 PRPRNT (MED SURG SUPPLIES) ×1
LABEL MED CORRECT MED LABELING SYS 4 FLG 2 SHEET 24 PRPRNT STRL (MED SURG SUPPLIES) ×1 IMPLANT
LINER SUCT MEDIVAC CRD TW LOCK LID SHTOF VALVE CAN PORT 3L LF  DISP (MED SURG SUPPLIES) ×1 IMPLANT
LINER SUCT MEDIVAC CRD TW LOCK_LID SHTOF VALVE CAN PORT 3L (MED SURG SUPPLIES) ×1
NEEDLE INSFL 120MM 14GA STD SRGNDL PNMPRTN SPRG LD BLUNT STY STRL LF  DISP SS PLASTIC RD (ENDOSCOPIC SUPPLIES) ×1 IMPLANT
NEEDLE INSFL 120MM 14GA STD SR_GNDL PNMPRTN SPRG LD BLUNT STY (INSTRUMENTS ENDOMECHANICAL) ×1
OMNIPAQUE POLYMER 50ML 350MG BOTTLE 50 ML (MEDICATIONS/SOLUTIONS) ×2 IMPLANT
PAD EG 15SQ IN UNIV FOAM SPLT NONCORD ADULT 9100 SER (CUTTING ELEMENTS) ×1
SET TUBING PNEUMOCLEAR HIFLO SMOKE EVAC (MED SURG SUPPLIES) ×1 IMPLANT
SET TUBING PNEUMOCLEAR HIFLO S_MOKE EVAC DEMOTE (MED SURG SUPPLIES) ×1
SHEARS ESURG 36CM 5MM HARMON ACE+7 CURVE 3 HNDCNTL BUTTON STRL LF  DISP (ENDOSCOPIC SUPPLIES) ×1
SHEARS ESURG 36CM 5MM HRMN A_+7 CRV ADV HMSTS 3 HNDCNTL ACT (INSTRUMENTS ENDOMECHANICAL) ×1
SLEEVE COMPRESS MED KNEE LGTH KENDALL SCD SEQ NONST LF  DISP 21- IN DVT PE (MED SURG SUPPLIES) ×1 IMPLANT
SLEEVE COMPRESS MED KNEE LGTH KENDALL SCD SEQ NONST LF DISP (MED SURG SUPPLIES) ×1
SOL ANFG DFGR ISOPRPNL PAD OVAL BTL NABRSV ADH STRL LF  DISP (ENDOSCOPIC SUPPLIES) ×1 IMPLANT
SOL IRRG 0.9% NACL 1000ML PLASTIC PR BTL ISTNC N-PYRG STRL LF (MEDICATIONS/SOLUTIONS) ×1 IMPLANT
SOL IV LR 1000ML N-PYRG FLXB CONTAINR STRL LF (MEDICATIONS/SOLUTIONS) ×1
SOL IV LR 1000ML PRSV FR FLXB CONTAINR LF (MEDICATIONS/SOLUTIONS) ×1 IMPLANT
SOLUTION ANTI FOG W/SPONGE_280101 DEFOG ENDOMATE 20EA/CS (INSTRUMENTS ENDOMECHANICAL) ×1
SOLUTION IRRG NS 2F7124 1000CC_12/CS (MEDICATIONS/SOLUTIONS) ×1
SPONGE GAUZE 2X2IN MDCHC COTTON 8P LF  STRL DISP WHT (WOUND CARE SUPPLY) ×4 IMPLANT
SPONGE SUPER TYPE 7 2X2 8 PLY_MEDC (WOUND CARE/ENTEROSTOMAL SUPPLY) ×4
SPONGE SURG 4X4IN 16 PLY XRY DETECT COTTON STRL LF  DISP (WOUND CARE SUPPLY) ×1 IMPLANT
SPONGE SURG 4X4IN 16 PLY_RADOPQ COT STRL LF DISP (WOUND CARE/ENTEROSTOMAL SUPPLY) ×1
STAPLER SKIN REG 35 STPL ROT HEAD EXTRACT SPRINGLOAD HNDL (SUTURE/WOUND CLOSURE) ×1
STAPLER SKIN REG 35 STPL ROT HEAD EXTRACT SPRINGLOAD HNDL STRL LF  MULTIFIRE PREM DISP SS (SUTURE/WOUND CLOSURE) ×1 IMPLANT
SUTURE 0 GS-22 POLYSRB 30IN VIOL BRD COAT ABS (SUTURE/WOUND CLOSURE) ×1
SYRINGE LL 10ML LF  STRL GRAD N-PYRG DEHP-FR PVC FREE MED DISP (MED SURG SUPPLIES) ×2 IMPLANT
SYRINGE LL 10ML LF STRL MED D_ISP (MED SURG SUPPLIES) ×2
TOWEL 24X16IN COTTON BLU DISP SURG STRL LF (DRAPE/PACKS/SHEETS/OR TOWEL) ×6 IMPLANT
TROCAR 5MM Z SLEEVE (INSTRUMENTS ENDOMECHANICAL) ×2
TROCAR LAPSCP 100MM 11MM KII Z THREAD SLEEVE SHIELD BLADE (INSTRUMENTS ENDOMECHANICAL) ×1
TROCAR LAPSCP 100MM 11MM KII Z THREAD SLEEVE SHIELD BLADE STRL LF  ACCESS SYS ABDOMINAL (ENDOSCOPIC SUPPLIES) ×1 IMPLANT
TROCAR LAPSCP 100MM 5MM KII Z THREAD SLEEVE SHIELD BLADE (INSTRUMENTS ENDOMECHANICAL) ×1
TUBE BUBBLE CONNECTING_8888280214 1EA/BX/CS (MED SURG SUPPLIES) ×1
TUBING SUCT CLR 100FT 3/16IN ARGYLE UNIV PVC NCDTV BBL NONST LF (MED SURG SUPPLIES) ×1 IMPLANT

## 2021-12-06 NOTE — Nurses Notes (Signed)
DR.I RANA IN AND REVIEWED DISCHARGE INSTRUCTIONS WITH PT AND FAMILY. BOTH VERBALIZED UNDERSTANDING.

## 2021-12-06 NOTE — Anesthesia Transfer of Care (Signed)
ANESTHESIA TRANSFER OF CARE   Lance Hughes is a 46 y.o. ,male, Weight: 120 kg (265 lb)   had Procedure(s):  LAPAROSCOPIC CHOLECYSTECTOMY  performed  12/06/21   Primary Service: Gaspar Garbe, MD    Past Medical History:   Diagnosis Date   . Anxiety    . Asthma    . Depression    . Depressive disorder    . GERD (gastroesophageal reflux disease)    . HTN (hypertension)    . Sleep apnea       Allergy History as of 12/06/21     PENICILLINS       Noted Status Severity Type Reaction    11/16/21 1127 Silvana Newness, MA 07/01/11 Active Low      Comments: Childhood reaction. Patient is unsure of what the medication does.  Childhood allergy     10/21/21 0728 Edrick Kins, RN 10/21/21 Active Low      Comments: Childhood reaction. Patient is unsure of what the medication does.           SULFA (SULFONAMIDES)       Noted Status Severity Type Reaction    11/16/21 1127 Silvana Newness, [place] 07/01/11 Active Low      Comments: Childhood reaction. Patient is unsure of what the medication does.   Childhood allergy     10/21/21 0729 Edrick Kins, RN 10/21/21 Active Low      Comments: Childhood reaction. Patient is unsure of what the medication does.                I completed my transfer of care / handoff to the receiving personnel during which we discussed:  Access, Airway, All key/critical aspects of case discussed, Analgesia, Antibiotics, Expectation of post procedure, Fluids/Product, Gave opportunity for questions and acknowledgement of understanding, Labs and PMHx  Report given to: Hetty Ely, RN    Post Location: PACU                                                           Last OR Temp: Temperature: 36.3 C (97.3 F)  ABG:  POTASSIUM   Date Value Ref Range Status   12/04/2021 3.4 (L) 3.5 - 5.1 mmol/L Final     CALCIUM   Date Value Ref Range Status   12/04/2021 9.4 8.6 - 10.3 mg/dL Final     Airway:* No LDAs found *  Blood pressure (!) 143/74, pulse 68, temperature 36.3 C (97.3 F), resp. rate 18, height 1.778 m ('5\' 10"'$ ),  weight 120 kg (265 lb), SpO2 99 %.

## 2021-12-06 NOTE — Anesthesia Preprocedure Evaluation (Addendum)
ANESTHESIA PRE-OP EVALUATION  Planned Procedure: LAPAROSCOPIC CHOLECYSTECTOMY (Abdomen)  Review of Systems     anesthesia history negative     patient summary reviewed  nursing notes reviewed        Pulmonary   asthma, sleep apnea and CPAP,   Cardiovascular    Hypertension, well controlled, ECG reviewed and ACE / ARB inhibitor use ,No peripheral edema,  Exercise Tolerance: > or = 4 METS        GI/Hepatic/Renal    GERD and well controlled        Endo/Other    obesity,      Neuro/Psych/MS    back abnormality, anxiety, depression     Cancer    negative hematology/oncology ROS,                   Physical Assessment      Airway       Mallampati: III    TM distance: >3 FB    Neck ROM: full  Mouth Opening: good.  Facial hair  Beard        Dental       Dentition intact             Pulmonary    Breath sounds clear to auscultation  (-) no rhonchi, no decreased breath sounds, no wheezes, no rales and no stridor     Cardiovascular    Rhythm: regular  Rate: Normal  (-) no friction rub, carotid bruit is not present, no peripheral edema and no murmur     Other findings            Plan  ASA 3     Planned anesthesia type: general     general anesthesia with endotracheal tube intubation    plan to administer opioids postoperatively    SLEEP APNEA  Patient is at risk of obstructive sleep apnea and Education provided regarding risk of obstructive sleep apnea        PONV/POV Plan:  I plan to administer pharmcologic prophalaxis antiemetics  Intravenous induction     Anesthesia issues/risks discussed are: Dental Injuries, Stroke, Difficult Airway, Nerve Injuries, Intraoperative Awareness/ Recall, Sore Throat, Eye /Visual Loss, Aspiration, Blood Loss, Cardiac Events/MI and PONV.  Anesthetic plan and risks discussed with patient  Signed consent obtained            Patient's NPO status is appropriate for Anesthesia.           Plan discussed with CRNA.    (Use glidescope)

## 2021-12-06 NOTE — OR PostOp (Signed)
1405-dressing to right upper quadrant re-inforced.

## 2021-12-06 NOTE — H&P (Signed)
[facility]  H&P Update Form    Lance Hughes, Lance Hughes, 46 y.o. male  Encounter Start Date: 11/16/2021   Date of Birth:  22-Jan-1976    12/06/2021    STOP: IF H&P IS GREATER THAN 30 DAYS FROM SURGICAL DAY COMPLETE NEW H&P IS REQUIRED.     H & P updated the day of the procedure.  1.  H&P was completed within 30 days of surgical procedure by Gaspar Garbe, MD  and has been reviewed within 24 hours of the surgery. The patient has been examined, and no change has occured in the patients condition since the H&P was completed.       Change in medications: No                  2.  Patient continues to be appropiate candidate for planned surgical procedure. YES      Gaspar Garbe, MD

## 2021-12-06 NOTE — Anesthesia Postprocedure Evaluation (Signed)
Anesthesia Post Op Evaluation    Patient: Lance Hughes  Procedure(s):  LAPAROSCOPIC CHOLECYSTECTOMY    Last Vitals:Temperature: 36.3 C (97.3 F) (12/06/21 1022)  Heart Rate: 84 (12/06/21 1105)  BP (Non-Invasive): 137/75 (12/06/21 1105)  Respiratory Rate: (!) 23 (12/06/21 1105)  SpO2: 96 % (12/06/21 1105)    No notable events documented.    Patient is sufficiently recovered from the effects of anesthesia to participate in the evaluation and has returned to their pre-procedure level.  Patient location during evaluation: PACU       Patient participation: complete - patient participated  Level of consciousness: awake and alert and responsive to verbal stimuli    Pain management: adequate  Airway patency: patent    Anesthetic complications: no  Cardiovascular status: acceptable  Respiratory status: acceptable  Hydration status: acceptable  Patient post-procedure temperature: Pt Normothermic   PONV Status: Absent

## 2021-12-06 NOTE — Discharge Instructions (Addendum)
CALL OFFICE TOMORROW TO SCHEDULE YOUR FOLLOW UP APPT IN  1 WEEK    Daily showers. Do NOT remove steri strips. They will fall off on their own, IF YOU HAVE ANY.    NO lifting over 15-20lbs until advised by Dr.    Sinclair Grooms low fat diet for one month.    Call Dr for any problems.    DO NOT forget to stop at pharmacy and pick up your prescription.

## 2021-12-06 NOTE — OR Surgeon (Signed)
[facility]    OPERATIVE NOTE    Patient Name: Lance Hughes, Lance Hughes MRN:: [id number]  Date of Birth: [birth year]  Date of Service: 12/06/2021     Pre-Operative Diagnosis: GALLSTONES     Post-Operative Diagnosis: GALLSTONES    Procedure(s)/Description:  LAPAROSCOPIC CHOLECYSTECTOMY:   Intraoperative cholangiogram    Attending Surgeon: Gaspar Garbe, MD     Assistant Surgeon:     Anesthesia Staff:  Anesthesiologist: Brantley Stage Exie Parody, MD  CRNA: Freddie Breech, CRNA    Anesthesia Type: .General     Estimated Blood Loss:  Minimal    Complications:  None    Condition:  Stable    Disposition:   PACU - hemodynamically stable.    Indications:  Patient is a 46 y.o. male who presents for laparoscopic cholecystectomy for chronic calculous cholecystitis .    Description of Procedure/Operation:  Patient was supine on the operating table under general anesthesia.  Abdomen was prepped and draped in a sterile field.  Small skin incision was placed at the umbilicus.  Veress needle was introduced into the peritoneal cavity and CO2 was insufflated.  5 mm trocar was introduced through the umbilical incision.  Tumor 5 mm trocars were introduced through the right subcostal incisions in the same fashion an 11 mm trocar through the epigastric incision.  There were no acute inflammatory changes around the gallbladder.  Gallbladder was thick walled with the excessive fatty tissue around it however no adhesions.  Liver was appearance of a fatty liver but no nodularity was present.  There was a large gallstone impacted in the proximal part of the gallbladder the infundibulum and the gallbladder could not be grasped.  The fatty tissue over it could not be grasped.  The grasping clamp was applied to the fundus of the gallbladder and it was retracted upwards along with the liver.  Dissection was done in the periodontal ligament area where excessive fatty tissue was noted.  Staying right on the gallbladder wall I was able to  identify the cystic duct and cystic artery clearly coming into the gallbladder.  Cystic duct was clipped and opened an intraoperative cholangiogram was performed using Reddick catheter which appears to be normal.  Cystic duct was then carefully clipped and I was then able to grasp the gallbladder and the remnant of the cystic duct.  Cystic artery was also clipped and dissection was continued using harmonic scalpel and electrocautery to free the gallbladder from the liver bed.  It had a thick fatty mesentery attaching it to the liver that being that part of the dissection quite easy.  Minimal bleeding was encountered gallbladder was removed using Endo-Catch bag after crushing the stone however the incision had to be enlarged to pull the gallbladder out.  Thorough irrigation was done for the subhepatic and subdiaphragmatic area tell the returning fluid was all clear.  CO2 was then evacuated trocars were removed fascia of the epigastric incision was closed with 0 Vicryl running stitch and skin was closed with the staples.  Dressing applied.  Patient tolerated well.    Gaspar Garbe, MD

## 2021-12-07 DIAGNOSIS — K8010 Calculus of gallbladder with chronic cholecystitis without obstruction: Secondary | ICD-10-CM

## 2021-12-07 LAB — SURGICAL PATHOLOGY SPECIMEN

## 2021-12-14 ENCOUNTER — Other Ambulatory Visit: Payer: Self-pay

## 2021-12-14 ENCOUNTER — Ambulatory Visit (INDEPENDENT_AMBULATORY_CARE_PROVIDER_SITE_OTHER): Payer: PRIVATE HEALTH INSURANCE | Admitting: Surgery

## 2021-12-14 ENCOUNTER — Encounter (INDEPENDENT_AMBULATORY_CARE_PROVIDER_SITE_OTHER): Payer: Self-pay | Admitting: Surgery

## 2021-12-14 DIAGNOSIS — Z4889 Encounter for other specified surgical aftercare: Secondary | ICD-10-CM

## 2021-12-14 NOTE — Progress Notes (Signed)
GENERAL [facility]GERY, [facility]  [address]  [place] [address]  Phone: [phone]  Fax: [phone]    Encounter Date: 12/14/2021    Patient ID:  Lance Hughes  GTX:[id number]    DOB: 03-20-1976  Age: 46 y.o. male    Subjective:     Chief Complaint   Patient presents with   . Post Op     Post op lap chole     HPI   This is 1st postop visit after laparoscopic cholecystectomy.  Patient has no complaints.  Current Outpatient Medications   Medication Sig   . albuterol sulfate (PROVENTIL OR VENTOLIN OR PROAIR) 90 mcg/actuation Inhalation oral inhaler albuterol sulfate HFA 90 mcg/actuation aerosol inhaler   INHALE 2 PUFFS BY MOUTH EVERY 6 HOURS AS NEEDED   . busPIRone (BUSPAR) 10 mg Oral Tablet 1 Tablet (10 mg total) Twice per day as needed (Patient not taking: Reported on 12/14/2021)   . DULoxetine (CYMBALTA DR) 60 mg Oral Capsule, Delayed Release(E.C.) duloxetine 60 mg capsule,delayed release   TAKE 1 CAPSULE BY MOUTH ONCE DAILY   . fluticasone propion-salmeteroL (ADVAIR) 100-50 mcg/dose Inhalation oral diskus inhaler Advair Diskus 100 mcg-50 mcg/dose powder for inhalation   INHALE 1 PUFF(S) INHALATION TWO TIMES A DAY   . gabapentin (NEURONTIN) 600 mg Oral Tablet gabapentin 600 mg tablet   TAKE 1 TABLET BY MOUTH TWICE DAILY   . Ibuprofen (MOTRIN) 800 mg Oral Tablet Take 1 Tablet (800 mg total) by mouth Three times a day as needed for Pain   . lisinopriL-hydrochlorothiazide (ZESTORETIC) 20-25 mg Oral Tablet lisinopril 20 mg-hydrochlorothiazide 25 mg tablet   TAKE 1 TABLET BY MOUTH DAILY   . LORazepam (ATIVAN) 1 mg Oral Tablet Take 1 Tablet (1 mg total) by mouth Twice daily   . meloxicam (MOBIC) 15 mg Oral Tablet Take 1 Tablet (15 mg total) by mouth Once a day   . methocarbamoL (ROBAXIN) 500 mg Oral Tablet TAKE 1 TABLET 3 TIMES A DAY BY ORAL ROUTE AS NEEDED FOR 13 DAYS. (Patient not taking: Reported on 12/14/2021)   . omeprazole (PRILOSEC) 20 mg Oral Capsule, Delayed Release(E.C.) Take 1 Capsule (20 mg total)  by mouth Once a day (Patient not taking: Reported on 12/14/2021)   . oxyCODONE-acetaminophen (PERCOCET) 5-325 mg Oral Tablet Take 1 Tablet by mouth Three times a day as needed   . pantoprazole (PROTONIX) 40 mg Oral Tablet, Delayed Release (E.C.) Take 1 Tablet (40 mg total) by mouth Every night     Allergies   Allergen Reactions   . Penicillins      Childhood reaction. Patient is unsure of what the medication does.  Childhood allergy   . Sulfa (Sulfonamides)      Childhood reaction. Patient is unsure of what the medication does.   Childhood allergy     Past Medical History:   Diagnosis Date   . Anxiety    . Asthma    . Depression    . Depressive disorder    . GERD (gastroesophageal reflux disease)    . HTN (hypertension)    . Sleep apnea          Past Surgical History:   Procedure Laterality Date   . HX CHOLECYSTECTOMY     . SHOULDER OPEN ROTATOR CUFF REPAIR Left    . SPINAL FUSION  2021    cage and rods         Family Medical History:     Problem Relation (Age of Onset)  Arthritis-rheumatoid Maternal Grandmother    Asthma Maternal Grandmother    COPD Father    Diverticulitis Father    Pancreatic Cancer Paternal Grandfather          Social History     Tobacco Use   . Smoking status: Former     Types: Cigarettes     Quit date: 2002     Years since quitting: 21.5   . Smokeless tobacco: Never   Vaping Use   . Vaping Use: Never used   Substance Use Topics   . Alcohol use: Yes     Comment: occasionally   . Drug use: Never         Objective:   Vitals: BP 132/64 (Site: Right, Patient Position: Sitting)   Pulse 74   Temp 36.7 C (98.1 F) (Oral)   Ht 1.778 m ('5\' 10"'$ )   Wt 121 kg (266 lb)   SpO2 96%   BMI 38.17 kg/m         Physical Exam  Patient is awake and alert and in no distress.  Vital signs are stable.  He is afebrile.  There is no scleral icterus.  Abdomen is soft and benign.  Staple lines are clean and dry.  There is no ankle edema or calf tenderness.  Assessment & Plan:     ENCOUNTER DIAGNOSES     ICD-10-CM    1. Postoperative visit  Z48.89     Staple removed.  Patient instructed to avoid any strenuous activity for another 4 weeks and avoid greasy fried food.  He will return in 4 months or sooner if needed.  No orders of the defined types were placed in this encounter.      No follow-ups on file.    Gaspar Garbe, MD

## 2021-12-27 NOTE — Patient Instructions (Addendum)
DUE TO SPACE LIMITATIONS, ONLY TWO VISITORS  (aged 47 and older) ARE ALLOWED TO COME WITH YOU AND STAY IN THE WAITING ROOM DURING YOUR PRE OP AND PROCEDURE.   **NO VISITORS ARE ALLOWED IN THE SHORT STAY AREA OR RECOVERY ROOM!!**   You are not required to quarantine at this time prior to your surgery. However, you must do this: Hand Hygiene often Do NOT share personal items Notify your provider if you are in close contact with someone who has COVID or you develop fever 100.4 or greater, new onset of sneezing, cough, sore throat, shortness of breath or body aches.       Your procedure is scheduled on:  Wednesday January 09, 2022  Report to [facility] Main Entrance.  Report to admitting at:  05:30 AM  +++++Call this number if you have any questions or problems the morning of surgery [phone]  Do not eat food :After Midnight the night prior to your surgery/procedure.  After Midnight you may have the following liquids until   05:30  AM  DAY OF SURGERY  Clear Liquid Diet Water Black Coffee (sugar ok, NO MILK/CREAM OR CREAMERS)  Tea (sugar ok, NO MILK/CREAM OR CREAMERS) regular and decaf                             Plain Jell-O (NO RED)                                           Fruit ices (not with fruit pulp, NO RED)                                     Popsicles (NO RED)                                                                  Juice: apple, WHITE grape, WHITE cranberry Sports drinks like Gatorade (NO RED)                   The day of surgery:  Drink ONE (1) Pre-Surgery Clear Ensure  at   05;30 AM the morning of surgery. Drink in one sitting. Do not sip.  This drink was given to you during your hospital pre-op appointment visit. Nothing else to drink after completing the Pre-Surgery Clear Ensure    FOLLOW BOWEL PREP AND ANY ADDITIONAL PRE OP INSTRUCTIONS YOU RECEIVED FROM YOUR SURGEON'S OFFICE!!!   Oral Hygiene is also important to reduce your risk of  infection.        Remember - BRUSH YOUR TEETH THE MORNING OF SURGERY WITH YOUR REGULAR TOOTHPASTE  Take ONLY these medicines the morning of surgery with A SIP OF WATER: percocet, Advair Inhaler, Gabapentin, cymbalta and if needed you may take- Lorazepam (Ativan) and Albuterol inhaler.   Bring CPAP mask and tubing day of surgery.                   You may not have any metal on your body including  jewelry, and  body piercing  Do not wear make-up, lotions, powders, perfumes, or deodorant  Men may shave face and neck.  DO NOT [place]. PHARMACY WILL DISPENSE MEDICATIONS LISTED ON YOUR MEDICATION LIST TO YOU DURING YOUR ADMISSION [place]!   Patients discharged on the day of surgery will not be allowed to drive home.  Someone NEEDS to stay with you for the first 24 hours after anesthesia.  Special Instructions: Bring a copy of your healthcare power of attorney and living will documents the day of surgery, if you wish to have them scanned into your [facility] Records- EPIC  Please read over the following fact sheets you were given: IF YOU HAVE QUESTIONS ABOUT YOUR PRE-OP INSTRUCTIONS, PLEASE CALL [phone]  ([place])   [facility] - Preparing for Surgery Before surgery, you can play an important role.  Because skin is not sterile, your skin needs to be as free of germs as possible.  You can reduce the number of germs on your skin by washing with CHG (chlorahexidine gluconate) soap before surgery.  CHG is an antiseptic cleaner which kills germs and bonds with the skin to continue killing germs even after washing. Please DO NOT use if you have an allergy to CHG or antibacterial soaps.  If your skin becomes reddened/irritated stop using the CHG and inform your nurse when you arrive at Short Stay. Do not shave (including legs and underarms) for at least 48 hours prior to the first CHG shower.  You may shave your face/neck.  Please follow these  instructions carefully:  1.  Shower with CHG Soap the night before surgery and the  morning of surgery.  2.  If you choose to wash your hair, wash your hair first as usual with your normal  shampoo.  3.  After you shampoo, rinse your hair and body thoroughly to remove the shampoo.                             4.  Use CHG as you would any other liquid soap.  You can apply chg directly to the skin and wash.  Gently with a scrungie or clean washcloth.  5.  Apply the CHG Soap to your body ONLY FROM THE NECK DOWN.   Do not use on face/ open                           Wound or open sores. Avoid contact with eyes, ears mouth and genitals (private parts).                       Wash face,  Genitals (private parts) with your normal soap.             6.  Wash thoroughly, paying special attention to the area where your  surgery  will be performed.  7.  Thoroughly rinse your body with warm water from the neck down.  8.  DO NOT shower/wash with your normal soap after using and rinsing off the CHG Soap.            9.  Pat yourself dry with a clean towel.            10.  Wear clean pajamas.            11.  Place clean sheets on your bed the night of your  first shower and do not  sleep with pets.  ON THE DAY OF SURGERY : Do not apply any lotions/deodorants the morning of surgery.  Please wear clean clothes to the hospital/surgery center.    FAILURE TO FOLLOW THESE INSTRUCTIONS MAY RESULT IN THE CANCELLATION OF YOUR SURGERY  PATIENT SIGNATURE_________________________________  NURSE SIGNATURE__________________________________  ________________________________________________________________________   Phillip Chen    An incentive spirometer is a tool that can help keep your lungs clear and active. This tool measures how well you are filling your lungs with each breath. Taking long deep breaths may help reverse or decrease the chance of developing breathing (pulmonary) problems (especially  infection) following: A long period of time when you are unable to move or be active. BEFORE THE PROCEDURE  If the spirometer includes an indicator to show your best effort, your nurse or respiratory therapist will set it to a desired goal. If possible, sit up straight or lean slightly forward. Try not to slouch. Hold the incentive spirometer in an upright position. INSTRUCTIONS FOR USE  Sit on the edge of your bed if possible, or sit up as far as you can in bed or on a chair. Hold the incentive spirometer in an upright position. Breathe out normally. Place the mouthpiece in your mouth and seal your lips tightly around it. Breathe in slowly and as deeply as possible, raising the piston or the ball toward the top of the column. Hold your breath for 3-5 seconds or for as long as possible. Allow the piston or ball to fall to the bottom of the column. Remove the mouthpiece from your mouth and breathe out normally. Rest for a few seconds and repeat Steps 1 through 7 at least 10 times every 1-2 hours when you are awake. Take your time and take a few normal breaths between deep breaths. The spirometer may include an indicator to show your best effort. Use the indicator as a goal to work toward during each repetition. After each set of 10 deep breaths, practice coughing to be sure your lungs are clear. If you have an incision (the cut made at the time of surgery), support your incision when coughing by placing a pillow or rolled up towels firmly against it. Once you are able to get out of bed, walk around indoors and cough well. You may stop using the incentive spirometer when instructed by your caregiver.  RISKS AND COMPLICATIONS Take your time so you do not get dizzy or light-headed. If you are in pain, you may need to take or ask for pain medication before doing incentive spirometry. It is harder to take a deep breath if you are having pain. AFTER USE Rest and breathe slowly and easily. It can be  helpful to keep track of a log of your progress. Your caregiver can provide you with a simple table to help with this. If you are using the spirometer at home, follow these instructions: [place] IF:  You are having difficultly using the spirometer. You have trouble using the spirometer as often as instructed. Your pain medication is not giving enough relief while using the spirometer. You develop fever of 100.5 F (38.1 C) or higher.  SEEK IMMEDIATE MEDICAL CARE IF:  You cough up bloody sputum that had not been present before. You develop fever of 102 F (38.9 C) or greater. You develop worsening pain at or near the incision site. MAKE SURE YOU:  Understand these instructions. Will watch your condition. Will get help right away if you are not doing well or get worse. Document Released: 09/23/2006 Document Revised: 08/05/2011 Document Reviewed: 11/24/2006 [facility] Patient Information 2014 [place], [place].

## 2021-12-27 NOTE — Progress Notes (Addendum)
COVID Vaccine received:  '[x]'$  No '[]'$  Yes  Date of any COVID positive Test in last 90 days: none  PCP - Cristina Gong, NP  [facility], Dr. Gala Romney   Chest x-ray -  EKG -  12-06-21  CEW   for his recent surgery Stress Test -  ECHO -  Cardiac Cath -   Pacemaker/ICD device     '[x]'$  N/A Spinal Cord Stimulator:'[]'$  No '[]'$  Yes   Other Implants:   History of Sleep Apnea? '[]'$  No '[x]'$  Yes  '[]'$  unknown Sleep Study Date:   CPAP used?- '[]'$  No '[x]'$  Yes  (Instruct to bring their mask & Tubing)  Does the patient monitor blood sugar? '[]'$  No '[]'$  Yes  '[x]'$  N/A  Blood Thinner Instructions: none Aspirin Instructions: Last Dose:  ERAS Protocol Ordered: '[]'$  No  '[x]'$  Yes PRE-SURGERY '[x]'$  ENSURE  '[]'$  G2   Comments: Patient recently (12-06-21) had a Lap. Cholecystectomy performed at [facility].   Activity level: Patient can not walk up a flight of stairs without having back and leg  pain. However, He would not have any CP or SOB.   Anesthesia review: asthma, elevated liver function tests(recent lap. Cholecystectomy 12-06-21 at [facility]), Hx ALIF, HTN  Patient denies shortness of breath, fever, cough and chest pain at PAT appointment  Patient verbalized understanding and agreement to the Pre-Surgical Instructions that were given to them at this PAT appointment. Patient was also educated of the need to review these PAT instructions again prior to his/her surgery.I reviewed the appropriate phone numbers to call if they have any and questions or concerns.

## 2021-12-28 DIAGNOSIS — M1711 Unilateral primary osteoarthritis, right knee: Secondary | ICD-10-CM | POA: Insufficient documentation

## 2021-12-31 ENCOUNTER — Encounter (HOSPITAL_COMMUNITY)
Admission: RE | Admit: 2021-12-31 | Discharge: 2021-12-31 | Disposition: A | Discharge status: Home / Self Care | Payer: BC Managed Care – PPO | Source: Ambulatory Visit | Attending: Orthopedic Surgery | Admitting: Orthopedic Surgery

## 2021-12-31 ENCOUNTER — Other Ambulatory Visit: Payer: Self-pay

## 2021-12-31 ENCOUNTER — Encounter (HOSPITAL_COMMUNITY): Payer: Self-pay

## 2021-12-31 ENCOUNTER — Ambulatory Visit: Payer: Self-pay | Admitting: Student

## 2021-12-31 VITALS — BP 131/84 | HR 73 | Temp 98.3°F | Resp 14 | Ht 71.0 in | Wt 263.0 lb

## 2021-12-31 DIAGNOSIS — J45909 Unspecified asthma, uncomplicated: Secondary | ICD-10-CM | POA: Insufficient documentation

## 2021-12-31 DIAGNOSIS — K760 Fatty (change of) liver, not elsewhere classified: Secondary | ICD-10-CM | POA: Insufficient documentation

## 2021-12-31 DIAGNOSIS — I10 Essential (primary) hypertension: Secondary | ICD-10-CM | POA: Diagnosis not present

## 2021-12-31 DIAGNOSIS — Z01812 Encounter for preprocedural laboratory examination: Secondary | ICD-10-CM | POA: Diagnosis present

## 2021-12-31 DIAGNOSIS — G4733 Obstructive sleep apnea (adult) (pediatric): Secondary | ICD-10-CM | POA: Insufficient documentation

## 2021-12-31 DIAGNOSIS — M1711 Unilateral primary osteoarthritis, right knee: Secondary | ICD-10-CM | POA: Diagnosis not present

## 2021-12-31 DIAGNOSIS — R7989 Other specified abnormal findings of blood chemistry: Secondary | ICD-10-CM | POA: Diagnosis not present

## 2021-12-31 DIAGNOSIS — Z01818 Encounter for other preprocedural examination: Secondary | ICD-10-CM

## 2021-12-31 DIAGNOSIS — Z9049 Acquired absence of other specified parts of digestive tract: Secondary | ICD-10-CM | POA: Diagnosis not present

## 2021-12-31 HISTORY — DX: Fatty (change of) liver, not elsewhere classified: K76.0

## 2021-12-31 HISTORY — DX: Pneumonia, unspecified organism: J18.9

## 2021-12-31 HISTORY — DX: Unspecified osteoarthritis, unspecified site: M19.90

## 2021-12-31 HISTORY — DX: Myoneural disorder, unspecified: G70.9

## 2021-12-31 LAB — COMPREHENSIVE METABOLIC PANEL
ALT: 90 U/L — ABNORMAL HIGH (ref 0–44)
AST: 56 U/L — ABNORMAL HIGH (ref 15–41)
Albumin: 3.9 g/dL (ref 3.5–5.0)
Alkaline Phosphatase: 108 U/L (ref 38–126)
Anion gap: 7 (ref 5–15)
BUN: 17 mg/dL (ref 6–20)
CO2: 27 mmol/L (ref 22–32)
Calcium: 8.6 mg/dL — ABNORMAL LOW (ref 8.9–10.3)
Chloride: 105 mmol/L (ref 98–111)
Creatinine, Ser: 0.88 mg/dL (ref 0.61–1.24)
GFR, Estimated: >60 mL/min (ref >60)
Glucose, Bld: 113 mg/dL — ABNORMAL HIGH (ref 70–99)
Potassium: 3.6 mmol/L (ref 3.5–5.1)
Sodium: 139 mmol/L (ref 135–145)
Total Bilirubin: 1 mg/dL (ref 0.3–1.2)
Total Protein: 7.1 g/dL (ref 6.5–8.1)

## 2021-12-31 LAB — CBC
HCT: 43.5 % (ref 39.0–52.0)
Hemoglobin: 15.1 g/dL (ref 13.0–17.0)
MCH: 31.6 pg (ref 26.0–34.0)
MCHC: 34.7 g/dL (ref 30.0–36.0)
MCV: 91 fL (ref 80.0–100.0)
Platelets: 226 10*3/uL (ref 150–400)
RBC: 4.78 MIL/uL (ref 4.22–5.81)
RDW: 12.2 % (ref 11.5–15.5)
WBC: 6.9 10*3/uL (ref 4.0–10.5)
nRBC: 0 % (ref 0.0–0.2)

## 2021-12-31 LAB — SURGICAL PCR SCREEN
MRSA, PCR: NEGATIVE
Staphylococcus aureus: NEGATIVE

## 2021-12-31 NOTE — H&P (Signed)
TOTAL KNEE ADMISSION H&P  Patient is being admitted for right total knee arthroplasty.  Subjective:  Chief Complaint:right knee pain.  HPI: Phillip Chen, 46 y.o. male, has a history of pain and functional disability in the right knee due to arthritis and has failed non-surgical conservative treatments for greater than 12 weeks to includeNSAID's and/or analgesics, corticosteriod injections, flexibility and strengthening excercises, supervised PT with diminished ADL's post treatment, and activity modification.  Onset of symptoms was gradual, starting 10 years ago with rapidlly worsening course since that time. The patient noted no past surgery on the right knee(s).  Patient currently rates pain in the right knee(s) at 10 out of 10 with activity. Patient has night pain, worsening of pain with activity and weight bearing, pain that interferes with activities of daily living, pain with passive range of motion, crepitus, and joint swelling.  Patient has evidence of subchondral cysts, subchondral sclerosis, periarticular osteophytes, and joint space narrowing by imaging studies. There is no active infection.  Patient Active Problem List   Diagnosis Date Noted   Fusion of lumbar spine 11/10/2019   Gastroesophageal reflux disease without esophagitis 07/06/2019   Abnormal finding on CT scan 07/06/2019   Adenomatous polyp of descending colon 06/11/2019   Past Medical History:  Diagnosis Date   Anxiety    Arthritis    Asthma    Chronic back pain    Fatty liver    GERD (gastroesophageal reflux disease)    HTN (hypertension)    Neuromuscular disorder ([place])    just dx with Lupus  12-2021   Pneumonia    Sleep apnea    CPAP    Past Surgical History:  Procedure Laterality Date   ABDOMINAL EXPOSURE N/A 11/10/2019   Procedure: ABDOMINAL EXPOSURE;  Surgeon: Serafina Mitchell, MD;  Location: [facility] OR;  Service: Vascular;  Laterality: N/A;   ANTERIOR LUMBAR FUSION N/A 11/10/2019   Procedure: ANTERIOR  LUMBAR FUSION (ALIF) LUMBAR FIVE-SACRAL ONE, POSTERIOR SPINAL FUSION INTERBODY LUMBAR FIVE -SACRAL ONE;  Surgeon: Melina Schools, MD;  Location: [facility];  Service: Orthopedics;  Laterality: N/A;  5.5 hrs Dr. Trula Slade to do approach Left sided tap block with exparel   CHOLECYSTECTOMY  12/06/2021   laparoscopic   COLONOSCOPY  05/26/2019   Dr. Rocco Serene; 2 hyperplastic polyp (0.5 cm and 0.4 cm), 4 fragments of tubular adenoma (0.3-0.6 cm), diverticulosis.  Recommended repeat colonoscopy in 3 years.   ESOPHAGOGASTRODUODENOSCOPY  07/12/2019   Dr. Rocco Serene; moderate acute gastritis, duodenitis, fundic gland polyp with benign pathology.  CLOtest negative.  Noted bile in his stomach which was seen refluxing into the esophagus and up to the level of his vocal cords.   LUMBAR FUSION  10/2019   SHOULDER SURGERY Left    TONSILLECTOMY      Current Outpatient Medications  Medication Sig Dispense Refill Last Dose   albuterol (VENTOLIN HFA) 108 (90 Base) MCG/ACT inhaler Inhale 1-2 puffs into the lungs every 6 (six) hours as needed for wheezing or shortness of breath.      DULoxetine (CYMBALTA) 60 MG capsule Take 60 mg by mouth in the morning.      fluticasone-salmeterol (ADVAIR) 100-50 MCG/ACT AEPB Inhale 1 puff into the lungs in the morning.      gabapentin (NEURONTIN) 600 MG tablet Take 600 mg by mouth in the morning and at bedtime.      ibuprofen (ADVIL) 800 MG tablet Take 800 mg by mouth 2 (two) times daily as needed (pain.).  lisinopril-hydrochlorothiazide (ZESTORETIC) 20-25 MG tablet Take 1 tablet by mouth in the morning.      LORazepam (ATIVAN) 1 MG tablet Take 1 mg by mouth 2 (two) times daily as needed for anxiety.      meloxicam (MOBIC) 15 MG tablet Take 15 mg by mouth daily as needed (pain.).      omeprazole (PRILOSEC) 20 MG capsule Take 1 capsule (20 mg total) by mouth 2 (two) times daily before a meal. (Patient not taking: Reported on 12/21/2021) 180 capsule 3    oxyCODONE-acetaminophen  (PERCOCET/ROXICET) 5-325 MG tablet Take 1 tablet by mouth in the morning, at noon, and at bedtime.      pantoprazole (PROTONIX) 40 MG tablet Take 40 mg by mouth at bedtime.      No current facility-administered medications for this visit.   Allergies  Allergen Reactions   Penicillins     Childhood allergy   Sulfa Antibiotics     Childhood allergy     Social History   Tobacco Use   Smoking status: Former    Types: Cigarettes    Quit date: 2002    Years since quitting: 21.6   Smokeless tobacco: Never  Substance Use Topics   Alcohol use: Yes    Comment: rare-once a month    Family History  Problem Relation Age of Onset   Diverticulitis Father    Colon cancer Maternal Uncle      Review of Systems  Musculoskeletal:  Positive for arthralgias, back pain, gait problem, joint swelling and myalgias.  All other systems reviewed and are negative.   Objective:  Physical Exam Constitutional:      Appearance: Normal appearance.  HENT:     Head: Normocephalic and atraumatic.     Nose: Nose normal.     Mouth/Throat:     Mouth: Mucous membranes are moist.     Pharynx: Oropharynx is clear.  Eyes:     Extraocular Movements: Extraocular movements intact.  Cardiovascular:     Rate and Rhythm: Normal rate and regular rhythm.     Pulses: Normal pulses.     Heart sounds: Normal heart sounds.  Pulmonary:     Effort: Pulmonary effort is normal.     Breath sounds: Normal breath sounds.  Abdominal:     General: Abdomen is flat.     Palpations: Abdomen is soft.  Genitourinary:    Comments: deferred Musculoskeletal:     Cervical back: Normal range of motion and neck supple.     Comments: Examination of the right knee reveals no skin wounds or lesions. He has swelling, moderate effusion. No warmth or erythema. Tenderness to palpation medial joint line, lateral, and patella. Range of motion is 0 to 110 degrees without any ligamentous instability. Painless range of motion of the  hip.  Sensory and motor function intact in LE bilaterally. Distal pulses 2+ bilaterally.   No significant pedal edema. Calves soft and non-tender.   Skin:    General: Skin is warm and dry.     Capillary Refill: Capillary refill takes less than 2 seconds.  Neurological:     General: No focal deficit present.     Mental Status: He is alert and oriented to person, place, and time.  Psychiatric:        Mood and Affect: Mood normal.        Behavior: Behavior normal.        Thought Content: Thought content normal.        Judgment: Judgment normal.  Vital signs in last 24 hours: '@VSRANGES'$ @  Labs:   Estimated body mass index is 36.68 kg/m as calculated from the following:   Height as of an earlier encounter on 12/31/21: '5\' 11"'$  (1.803 m).   Weight as of an earlier encounter on 12/31/21: 119.3 kg.   Imaging Review Plain radiographs demonstrate severe degenerative joint disease of the right knee(s). The overall alignment ismild varus. The bone quality appears to be adequate for age and reported activity level.      Assessment/Plan:  End stage arthritis, right knee   The patient history, physical examination, clinical judgment of the provider and imaging studies are consistent with end stage degenerative joint disease of the right knee(s) and total knee arthroplasty is deemed medically necessary. The treatment options including medical management, injection therapy arthroscopy and arthroplasty were discussed at length. The risks and benefits of total knee arthroplasty were presented and reviewed. The risks due to aseptic loosening, infection, stiffness, patella tracking problems, thromboembolic complications and other imponderables were discussed. The patient acknowledged the explanation, agreed to proceed with the plan and consent was signed. Patient is being admitted for inpatient treatment for surgery, pain control, PT, OT, prophylactic antibiotics, VTE prophylaxis, progressive  ambulation and ADL's and discharge planning. The patient is planning to be discharged home with OPPT.   Therapy Plans: outpatient therapy. [facility] in [place]. Rx provided today.  Disposition: Home with mom Planned DVT Prophylaxis: aspirin '81mg'$  BID DME needed: walker. Ice man today.  PCP: Cleared TXA: IV Allergies:  - Penicillin - childhood. Has taken amoxicillin in the past with no reaction. - Sulfa drugs - childhood. Reaction unknown.  Anesthesia Concerns: Difficulty waking up due to sleep apnea and decrease in O2 saturation.  BMI: 36.6 Last HgbA1c: 5.4 Other: - Asthma - Chronic pain syndrome. In pain management, oxycodone 3/325 TID. We discussed that he can have an increase in pain post-operative due to his chronic narcotic use. We discussed trying to stop taking his narcotics prior to surgery.  - Sleep apnea, uses CPAP. - Chronic lower back pain with fusion. L5-S1.  - Recent lupus diagnosis. Going to Rheumatology Thursday. Discussed about not starting immunosuppressive medications prior to surgery as they can cause increased risk of infection and delayed wound healing and potentially having to cancel his surgery.  - Cr: 1.29.  - Oxycodone, meloxicam if Cr within normal limits, methocarbamol, zofran.     Patient's anticipated LOS is less than 2 midnights, meeting these requirements: - Younger than 41 - Lives within 1 hour of care - Has a competent adult at home to recover with post-op recover - NO history of  - Chronic pain requiring opiods  - Diabetes  - Coronary Artery Disease  - Heart failure  - Heart attack  - Stroke  - DVT/VTE  - Cardiac arrhythmia  - Respiratory Failure/COPD  - Renal failure  - Anemia  - Advanced Liver disease

## 2021-12-31 NOTE — H&P (View-Only) (Signed)
TOTAL KNEE ADMISSION H&P  Patient is being admitted for right total knee arthroplasty.  Subjective:  Chief Complaint:right knee pain.  HPI: Phillip Chen, 46 y.o. male, has a history of pain and functional disability in the right knee due to arthritis and has failed non-surgical conservative treatments for greater than 12 weeks to includeNSAID's and/or analgesics, corticosteriod injections, flexibility and strengthening excercises, supervised PT with diminished ADL's post treatment, and activity modification.  Onset of symptoms was gradual, starting 10 years ago with rapidlly worsening course since that time. The patient noted no past surgery on the right knee(s).  Patient currently rates pain in the right knee(s) at 10 out of 10 with activity. Patient has night pain, worsening of pain with activity and weight bearing, pain that interferes with activities of daily living, pain with passive range of motion, crepitus, and joint swelling.  Patient has evidence of subchondral cysts, subchondral sclerosis, periarticular osteophytes, and joint space narrowing by imaging studies. There is no active infection.  Patient Active Problem List   Diagnosis Date Noted   Fusion of lumbar spine 11/10/2019   Gastroesophageal reflux disease without esophagitis 07/06/2019   Abnormal finding on CT scan 07/06/2019   Adenomatous polyp of descending colon 06/11/2019   Past Medical History:  Diagnosis Date   Anxiety    Arthritis    Asthma    Chronic back pain    Fatty liver    GERD (gastroesophageal reflux disease)    HTN (hypertension)    Neuromuscular disorder ([place])    just dx with Lupus  12-2021   Pneumonia    Sleep apnea    CPAP    Past Surgical History:  Procedure Laterality Date   ABDOMINAL EXPOSURE N/A 11/10/2019   Procedure: ABDOMINAL EXPOSURE;  Surgeon: Serafina Mitchell, MD;  Location: [facility];  Service: Vascular;  Laterality: N/A;   ANTERIOR LUMBAR FUSION N/A 11/10/2019   Procedure: ANTERIOR  LUMBAR FUSION (ALIF) LUMBAR FIVE-SACRAL ONE, POSTERIOR SPINAL FUSION INTERBODY LUMBAR FIVE -SACRAL ONE;  Surgeon: Melina Schools, MD;  Location: [place];  Service: Orthopedics;  Laterality: N/A;  5.5 hrs Dr. Trula Slade to do approach Left sided tap block with exparel   CHOLECYSTECTOMY  12/06/2021   laparoscopic   COLONOSCOPY  05/26/2019   Dr. Rocco Serene; 2 hyperplastic polyp (0.5 cm and 0.4 cm), 4 fragments of tubular adenoma (0.3-0.6 cm), diverticulosis.  Recommended repeat colonoscopy in 3 years.   ESOPHAGOGASTRODUODENOSCOPY  07/12/2019   Dr. Rocco Serene; moderate acute gastritis, duodenitis, fundic gland polyp with benign pathology.  CLOtest negative.  Noted bile in his stomach which was seen refluxing into the esophagus and up to the level of his vocal cords.   LUMBAR FUSION  10/2019   SHOULDER SURGERY Left    TONSILLECTOMY      Current Outpatient Medications  Medication Sig Dispense Refill Last Dose   albuterol (VENTOLIN HFA) 108 (90 Base) MCG/ACT inhaler Inhale 1-2 puffs into the lungs every 6 (six) hours as needed for wheezing or shortness of breath.      DULoxetine (CYMBALTA) 60 MG capsule Take 60 mg by mouth in the morning.      fluticasone-salmeterol (ADVAIR) 100-50 MCG/ACT AEPB Inhale 1 puff into the lungs in the morning.      gabapentin (NEURONTIN) 600 MG tablet Take 600 mg by mouth in the morning and at bedtime.      ibuprofen (ADVIL) 800 MG tablet Take 800 mg by mouth 2 (two) times daily as needed (pain.).  lisinopril-hydrochlorothiazide (ZESTORETIC) 20-25 MG tablet Take 1 tablet by mouth in the morning.      LORazepam (ATIVAN) 1 MG tablet Take 1 mg by mouth 2 (two) times daily as needed for anxiety.      meloxicam (MOBIC) 15 MG tablet Take 15 mg by mouth daily as needed (pain.).      omeprazole (PRILOSEC) 20 MG capsule Take 1 capsule (20 mg total) by mouth 2 (two) times daily before a meal. (Patient not taking: Reported on 12/21/2021) 180 capsule 3    oxyCODONE-acetaminophen  (PERCOCET/ROXICET) 5-325 MG tablet Take 1 tablet by mouth in the morning, at noon, and at bedtime.      pantoprazole (PROTONIX) 40 MG tablet Take 40 mg by mouth at bedtime.      No current facility-administered medications for this visit.   Allergies  Allergen Reactions   Penicillins     Childhood allergy   Sulfa Antibiotics     Childhood allergy     Social History   Tobacco Use   Smoking status: Former    Types: Cigarettes    Quit date: 2002    Years since quitting: 21.6   Smokeless tobacco: Never  Substance Use Topics   Alcohol use: Yes    Comment: rare-once a month    Family History  Problem Relation Age of Onset   Diverticulitis Father    Colon cancer Maternal Uncle      Review of Systems  Musculoskeletal:  Positive for arthralgias, back pain, gait problem, joint swelling and myalgias.  All other systems reviewed and are negative.   Objective:  Physical Exam Constitutional:      Appearance: Normal appearance.  HENT:     Head: Normocephalic and atraumatic.     Nose: Nose normal.     Mouth/Throat:     Mouth: Mucous membranes are moist.     Pharynx: Oropharynx is clear.  Eyes:     Extraocular Movements: Extraocular movements intact.  Cardiovascular:     Rate and Rhythm: Normal rate and regular rhythm.     Pulses: Normal pulses.     Heart sounds: Normal heart sounds.  Pulmonary:     Effort: Pulmonary effort is normal.     Breath sounds: Normal breath sounds.  Abdominal:     General: Abdomen is flat.     Palpations: Abdomen is soft.  Genitourinary:    Comments: deferred Musculoskeletal:     Cervical back: Normal range of motion and neck supple.     Comments: Examination of the right knee reveals no skin wounds or lesions. He has swelling, moderate effusion. No warmth or erythema. Tenderness to palpation medial joint line, lateral, and patella. Range of motion is 0 to 110 degrees without any ligamentous instability. Painless range of motion of the  hip.  Sensory and motor function intact in LE bilaterally. Distal pulses 2+ bilaterally.   No significant pedal edema. Calves soft and non-tender.   Skin:    General: Skin is warm and dry.     Capillary Refill: Capillary refill takes less than 2 seconds.  Neurological:     General: No focal deficit present.     Mental Status: He is alert and oriented to person, place, and time.  Psychiatric:        Mood and Affect: Mood normal.        Behavior: Behavior normal.        Thought Content: Thought content normal.        Judgment: Judgment normal.  Vital signs in last 24 hours: '@VSRANGES'$ @  Labs:   Estimated body mass index is 36.68 kg/m as calculated from the following:   Height as of an earlier encounter on 12/31/21: '5\' 11"'$  (1.803 m).   Weight as of an earlier encounter on 12/31/21: 119.3 kg.   Imaging Review Plain radiographs demonstrate severe degenerative joint disease of the right knee(s). The overall alignment ismild varus. The bone quality appears to be adequate for age and reported activity level.      Assessment/Plan:  End stage arthritis, right knee   The patient history, physical examination, clinical judgment of the provider and imaging studies are consistent with end stage degenerative joint disease of the right knee(s) and total knee arthroplasty is deemed medically necessary. The treatment options including medical management, injection therapy arthroscopy and arthroplasty were discussed at length. The risks and benefits of total knee arthroplasty were presented and reviewed. The risks due to aseptic loosening, infection, stiffness, patella tracking problems, thromboembolic complications and other imponderables were discussed. The patient acknowledged the explanation, agreed to proceed with the plan and consent was signed. Patient is being admitted for inpatient treatment for surgery, pain control, PT, OT, prophylactic antibiotics, VTE prophylaxis, progressive  ambulation and ADL's and discharge planning. The patient is planning to be discharged home with OPPT.   Therapy Plans: outpatient therapy. [facility] in [place]. Rx provided today.  Disposition: Home with mom Planned DVT Prophylaxis: aspirin '81mg'$  BID DME needed: walker. Ice man today.  PCP: Cleared TXA: IV Allergies:  - Penicillin - childhood. Has taken amoxicillin in the past with no reaction. - Sulfa drugs - childhood. Reaction unknown.  Anesthesia Concerns: Difficulty waking up due to sleep apnea and decrease in O2 saturation.  BMI: 36.6 Last HgbA1c: 5.4 Other: - Asthma - Chronic pain syndrome. In pain management, oxycodone 3/325 TID. We discussed that he can have an increase in pain post-operative due to his chronic narcotic use. We discussed trying to stop taking his narcotics prior to surgery.  - Sleep apnea, uses CPAP. - Chronic lower back pain with fusion. L5-S1.  - Recent lupus diagnosis. Going to Rheumatology Thursday. Discussed about not starting immunosuppressive medications prior to surgery as they can cause increased risk of infection and delayed wound healing and potentially having to cancel his surgery.  - Cr: 1.29.  - Oxycodone, meloxicam if Cr within normal limits, methocarbamol, zofran.     Patient's anticipated LOS is less than 2 midnights, meeting these requirements: - Younger than 59 - Lives within 1 hour of care - Has a competent adult at home to recover with post-op recover - NO history of  - Chronic pain requiring opiods  - Diabetes  - Coronary Artery Disease  - Heart failure  - Heart attack  - Stroke  - DVT/VTE  - Cardiac arrhythmia  - Respiratory Failure/COPD  - Renal failure  - Anemia  - Advanced Liver disease

## 2022-01-01 NOTE — Anesthesia Preprocedure Evaluation (Addendum)
Anesthesia Evaluation  Patient identified by MRN, date of birth, ID band Patient awake    Reviewed: Allergy & Precautions, H&P , NPO status , Patient's Chart, lab work & pertinent test results  Airway Mallampati: III  TM Distance: >3 FB Neck ROM: Full    Dental no notable dental hx. (+) Teeth Intact, Dental Advisory Given   Pulmonary asthma , sleep apnea and Continuous Positive Airway Pressure Ventilation , former smoker,    Pulmonary exam normal breath sounds clear to auscultation       Cardiovascular Exercise Tolerance: Good hypertension, Pt. on medications  Rhythm:Regular Rate:Normal     Neuro/Psych Anxiety negative neurological ROS     GI/Hepatic Neg liver ROS, GERD  Medicated,  Endo/Other  Morbid obesity  Renal/GU negative Renal ROS  negative genitourinary   Musculoskeletal  (+) Arthritis , Osteoarthritis,    Abdominal   Peds  Hematology negative hematology ROS (+)   Anesthesia Other Findings   Reproductive/Obstetrics negative OB ROS                           Anesthesia Physical Anesthesia Plan  ASA: 3  Anesthesia Plan: Spinal   Post-op Pain Management: Regional block*   Induction: Intravenous  PONV Risk Score and Plan: 2 and Ondansetron, Propofol infusion and Midazolam  Airway Management Planned: Natural Airway and Simple Face Mask  Additional Equipment:   Intra-op Plan:   Post-operative Plan:   Informed Consent: I have reviewed the patients History and Physical, chart, labs and discussed the procedure including the risks, benefits and alternatives for the proposed anesthesia with the patient or authorized representative who has indicated his/her understanding and acceptance.     Dental advisory given  Plan Discussed with: CRNA  Anesthesia Plan Comments: (See APP note by Durel Salts, FNP )      Anesthesia Quick Evaluation

## 2022-01-01 NOTE — Progress Notes (Signed)
Anesthesia Chart Review:   Case: [id number] Date/Time: 01/09/22 0815   Procedure: COMPUTER ASSISTED TOTAL KNEE ARTHROPLASTY (Right: Knee)   Anesthesia type: Spinal   Pre-op diagnosis: Right knee osteoarthritis   Location: [facility] ROOM 07 / [facility]   Surgeons: Rod Can, MD       DISCUSSION: Pt is 46 years old with hx HTN, asthma, OSA, fatty liver, lupus  VS: BP 131/84   Pulse 73   Temp 36.8 C (Oral)   Resp 14   Ht '5\' 11"'$  (1.803 m)   Wt 119.3 kg   SpO2 98%   BMI 36.68 kg/m   PROVIDERS: - PCP is  Cristina Gong, NP in [place], [place]   LABS: Labs reviewed: Acceptable for surgery. - AST 56, ALT 90. This is consistent with prior results. Fatty liver on [place] in June. S/p cholecystectomy 12/06/21 (notes in care everywhere)   (all labs ordered are listed, but only abnormal results are displayed)  Labs Reviewed  COMPREHENSIVE METABOLIC PANEL - Abnormal; Notable for the following components:      Result Value   Glucose, Bld 113 (*)    Calcium 8.6 (*)    AST 56 (*)    ALT 90 (*)    All other components within normal limits  SURGICAL PCR SCREEN  CBC    IMAGES: CXR 10/29/21:  - No acute findings.   [place] RUQ 10/29/21 (care everywhere):  - Stone and sludge filled gallbladder with no specific evidence for acute cholecystitis  - Fatty liver    EKG 12/06/21: report in care everywhere documents NSR. Tracing requested.   CV: N/A  Past Medical History:  Diagnosis Date   Anxiety    Arthritis    Asthma    Chronic back pain    Fatty liver    GERD (gastroesophageal reflux disease)    HTN (hypertension)    Neuromuscular disorder ([facility])    just dx with Lupus  12-2021   Pneumonia    Sleep apnea    CPAP    Past Surgical History:  Procedure Laterality Date   ABDOMINAL EXPOSURE N/A 11/10/2019   Procedure: ABDOMINAL EXPOSURE;  Surgeon: Serafina Mitchell, MD;  Location: [facility];  Service: Vascular;  Laterality: N/A;   ANTERIOR LUMBAR FUSION N/A 11/10/2019   Procedure: ANTERIOR LUMBAR  FUSION (ALIF) LUMBAR FIVE-SACRAL ONE, POSTERIOR SPINAL FUSION INTERBODY LUMBAR FIVE -SACRAL ONE;  Surgeon: Melina Schools, MD;  Location: [place];  Service: Orthopedics;  Laterality: N/A;  5.5 hrs Dr. Trula Slade to do approach Left sided tap block with exparel   CHOLECYSTECTOMY  12/06/2021   laparoscopic   COLONOSCOPY  05/26/2019   Dr. Rocco Serene; 2 hyperplastic polyp (0.5 cm and 0.4 cm), 4 fragments of tubular adenoma (0.3-0.6 cm), diverticulosis.  Recommended repeat colonoscopy in 3 years.   ESOPHAGOGASTRODUODENOSCOPY  07/12/2019   Dr. Rocco Serene; moderate acute gastritis, duodenitis, fundic gland polyp with benign pathology.  CLOtest negative.  Noted bile in his stomach which was seen refluxing into the esophagus and up to the level of his vocal cords.   LUMBAR FUSION  10/2019   SHOULDER SURGERY Left    TONSILLECTOMY      MEDICATIONS:  albuterol (VENTOLIN HFA) 108 (90 Base) MCG/ACT inhaler   DULoxetine (CYMBALTA) 60 MG capsule   fluticasone-salmeterol (ADVAIR) 100-50 MCG/ACT AEPB   gabapentin (NEURONTIN) 600 MG tablet   ibuprofen (ADVIL) 800 MG tablet   lisinopril-hydrochlorothiazide (ZESTORETIC) 20-25 MG tablet   LORazepam (ATIVAN) 1 MG tablet   meloxicam (MOBIC) 15 MG tablet  omeprazole (PRILOSEC) 20 MG capsule   oxyCODONE-acetaminophen (PERCOCET/ROXICET) 5-325 MG tablet   pantoprazole (PROTONIX) 40 MG tablet   No current facility-administered medications for this encounter.    If no changes, I anticipate pt can proceed with surgery as scheduled.   Willeen Cass, PhD, FNP-BC [facility]/Anesthesiology Phone: [phone] 01/01/2022 2:12 PM

## 2022-01-09 ENCOUNTER — Ambulatory Visit (HOSPITAL_COMMUNITY): Payer: BC Managed Care – PPO | Admitting: Emergency Medicine

## 2022-01-09 ENCOUNTER — Ambulatory Visit (HOSPITAL_COMMUNITY): Payer: BC Managed Care – PPO

## 2022-01-09 ENCOUNTER — Encounter (HOSPITAL_COMMUNITY): Payer: Self-pay | Admitting: Orthopedic Surgery

## 2022-01-09 ENCOUNTER — Ambulatory Visit (HOSPITAL_COMMUNITY)
Admission: RE | Admit: 2022-01-09 | Discharge: 2022-01-09 | Disposition: A | Discharge status: Home / Self Care | Payer: BC Managed Care – PPO | Attending: Orthopedic Surgery | Admitting: Orthopedic Surgery

## 2022-01-09 ENCOUNTER — Ambulatory Visit (HOSPITAL_COMMUNITY): Payer: BC Managed Care – PPO | Admitting: Anesthesiology

## 2022-01-09 ENCOUNTER — Other Ambulatory Visit: Payer: Self-pay

## 2022-01-09 ENCOUNTER — Encounter (HOSPITAL_COMMUNITY)
Admission: RE | Disposition: A | Discharge status: Home / Self Care | Payer: Self-pay | Source: Home / Self Care | Attending: Orthopedic Surgery

## 2022-01-09 DIAGNOSIS — K219 Gastro-esophageal reflux disease without esophagitis: Secondary | ICD-10-CM | POA: Diagnosis not present

## 2022-01-09 DIAGNOSIS — M1711 Unilateral primary osteoarthritis, right knee: Secondary | ICD-10-CM | POA: Diagnosis not present

## 2022-01-09 DIAGNOSIS — Z6836 Body mass index (BMI) 36.0-36.9, adult: Secondary | ICD-10-CM | POA: Insufficient documentation

## 2022-01-09 DIAGNOSIS — Z87891 Personal history of nicotine dependence: Secondary | ICD-10-CM | POA: Diagnosis not present

## 2022-01-09 DIAGNOSIS — I10 Essential (primary) hypertension: Secondary | ICD-10-CM | POA: Diagnosis not present

## 2022-01-09 DIAGNOSIS — G4730 Sleep apnea, unspecified: Secondary | ICD-10-CM | POA: Insufficient documentation

## 2022-01-09 HISTORY — PX: KNEE ARTHROPLASTY: SHX992

## 2022-01-09 SURGERY — ARTHROPLASTY, KNEE, TOTAL, USING IMAGELESS COMPUTER-ASSISTED NAVIGATION
Anesthesia: Spinal | Site: Knee | Laterality: Right

## 2022-01-09 MED ORDER — BUPIVACAINE-EPINEPHRINE (PF) 0.5% -1:200000 IJ SOLN
INTRAMUSCULAR | Status: DC | PRN
  Administered 2022-01-09: 20 mL via PERINEURAL

## 2022-01-09 MED ORDER — SENNA 8.6 MG PO TABS: 2.0000 | ORAL_TABLET | Freq: Every day | ORAL | 1 refills | Status: AC

## 2022-01-09 MED ORDER — POLYETHYLENE GLYCOL 3350 17 G PO PACK: 17.0000 g | PACK | Freq: Every day | ORAL | 0 refills | Status: AC | PRN

## 2022-01-09 MED ORDER — ONDANSETRON HCL 4 MG/2ML IJ SOLN: 4.0000 mg | Freq: Four times a day (QID) | INTRAMUSCULAR | Status: DC | PRN

## 2022-01-09 MED ORDER — LACTATED RINGERS IV SOLN: INTRAVENOUS | Status: DC

## 2022-01-09 MED ORDER — BUPIVACAINE-EPINEPHRINE (PF) 0.5% -1:200000 IJ SOLN
INTRAMUSCULAR | Status: DC | PRN
  Administered 2022-01-09: 30 mL

## 2022-01-09 MED ORDER — OXYCODONE HCL 5 MG PO TABS
10.0000 mg | ORAL_TABLET | ORAL | Status: DC | PRN
  Administered 2022-01-09: 10 mg via ORAL

## 2022-01-09 MED ORDER — OXYCODONE HCL 5 MG PO TABS: 5.0000 mg | ORAL_TABLET | ORAL | Status: DC | PRN

## 2022-01-09 MED ORDER — DOCUSATE SODIUM 100 MG PO CAPS: 100.0000 mg | ORAL_CAPSULE | Freq: Two times a day (BID) | ORAL | 1 refills | Status: AC

## 2022-01-09 MED ORDER — FENTANYL CITRATE (PF) 100 MCG/2ML IJ SOLN
INTRAMUSCULAR | Status: AC
  Filled 2022-01-09: qty 2

## 2022-01-09 MED ORDER — LACTATED RINGERS IV BOLUS
500.0000 mL | Freq: Once | INTRAVENOUS | Status: AC
  Administered 2022-01-09: 500 mL via INTRAVENOUS

## 2022-01-09 MED ORDER — OXYCODONE HCL 5 MG PO TABS: 5.0000 mg | ORAL_TABLET | ORAL | 0 refills | Status: AC | PRN

## 2022-01-09 MED ORDER — TRANEXAMIC ACID-NACL 1000-0.7 MG/100ML-% IV SOLN
1000.0000 mg | INTRAVENOUS | Status: AC
  Administered 2022-01-09: 1000 mg via INTRAVENOUS
  Filled 2022-01-09: qty 100

## 2022-01-09 MED ORDER — ACETAMINOPHEN 10 MG/ML IV SOLN: 1000.0000 mg | Freq: Four times a day (QID) | INTRAVENOUS | Status: DC

## 2022-01-09 MED ORDER — CHLORHEXIDINE GLUCONATE 0.12 % MT SOLN
15.0000 mL | Freq: Once | OROMUCOSAL | Status: AC
  Administered 2022-01-09: 15 mL via OROMUCOSAL

## 2022-01-09 MED ORDER — ONDANSETRON HCL 4 MG PO TABS: 4.0000 mg | ORAL_TABLET | Freq: Three times a day (TID) | ORAL | 0 refills | Status: AC | PRN

## 2022-01-09 MED ORDER — CEFAZOLIN SODIUM-DEXTROSE 2-4 GM/100ML-% IV SOLN
2.0000 g | Freq: Four times a day (QID) | INTRAVENOUS | Status: DC
  Administered 2022-01-09: 2 g via INTRAVENOUS

## 2022-01-09 MED ORDER — CEFAZOLIN SODIUM-DEXTROSE 2-4 GM/100ML-% IV SOLN
INTRAVENOUS | Status: AC
  Filled 2022-01-09: qty 100

## 2022-01-09 MED ORDER — METOCLOPRAMIDE HCL 5 MG PO TABS: 5.0000 mg | ORAL_TABLET | Freq: Three times a day (TID) | ORAL | Status: DC | PRN

## 2022-01-09 MED ORDER — PHENYLEPHRINE HCL-NACL 20-0.9 MG/250ML-% IV SOLN
INTRAVENOUS | Status: DC | PRN
  Administered 2022-01-09: 25 ug/min via INTRAVENOUS

## 2022-01-09 MED ORDER — FENTANYL CITRATE (PF) 100 MCG/2ML IJ SOLN
INTRAMUSCULAR | Status: DC | PRN
  Administered 2022-01-09 (×2): 50 ug via INTRAVENOUS

## 2022-01-09 MED ORDER — SODIUM CHLORIDE (PF) 0.9 % IJ SOLN
INTRAMUSCULAR | Status: DC | PRN
  Administered 2022-01-09: 30 mL

## 2022-01-09 MED ORDER — DEXAMETHASONE SODIUM PHOSPHATE 10 MG/ML IJ SOLN
INTRAMUSCULAR | Status: AC
  Filled 2022-01-09: qty 1

## 2022-01-09 MED ORDER — ACETAMINOPHEN 500 MG PO TABS
1000.0000 mg | ORAL_TABLET | Freq: Once | ORAL | Status: AC
  Administered 2022-01-09: 500 mg via ORAL
  Filled 2022-01-09: qty 2

## 2022-01-09 MED ORDER — BUPIVACAINE-EPINEPHRINE (PF) 0.5% -1:200000 IJ SOLN
INTRAMUSCULAR | Status: AC
Start: 2022-01-09 — End: ?
  Filled 2022-01-09: qty 30

## 2022-01-09 MED ORDER — MELOXICAM 15 MG PO TABS
15.0000 mg | ORAL_TABLET | Freq: Every day | ORAL | 2 refills | Status: AC
Start: 2022-01-09 — End: 2022-04-09

## 2022-01-09 MED ORDER — KETOROLAC TROMETHAMINE 15 MG/ML IJ SOLN: 15.0000 mg | Freq: Four times a day (QID) | INTRAMUSCULAR | Status: DC

## 2022-01-09 MED ORDER — METHOCARBAMOL 500 MG PO TABS: 500.0000 mg | ORAL_TABLET | Freq: Four times a day (QID) | ORAL | Status: DC | PRN

## 2022-01-09 MED ORDER — METHOCARBAMOL 500 MG IVPB - SIMPLE MED
INTRAVENOUS | Status: AC
  Filled 2022-01-09: qty 55

## 2022-01-09 MED ORDER — PHENYLEPHRINE HCL (PRESSORS) 10 MG/ML IV SOLN
INTRAVENOUS | Status: AC
  Filled 2022-01-09: qty 1

## 2022-01-09 MED ORDER — MIDAZOLAM HCL 5 MG/5ML IJ SOLN
INTRAMUSCULAR | Status: DC | PRN
  Administered 2022-01-09: 2 mg via INTRAVENOUS

## 2022-01-09 MED ORDER — CEFAZOLIN SODIUM-DEXTROSE 2-4 GM/100ML-% IV SOLN
2.0000 g | INTRAVENOUS | Status: AC
  Administered 2022-01-09: 2 g via INTRAVENOUS
  Filled 2022-01-09: qty 100

## 2022-01-09 MED ORDER — POVIDONE-IODINE 10 % EX SWAB
2.0000 | Freq: Once | CUTANEOUS | Status: AC
  Administered 2022-01-09: 2 via TOPICAL

## 2022-01-09 MED ORDER — ASPIRIN 81 MG PO CHEW: 81.0000 mg | CHEWABLE_TABLET | Freq: Two times a day (BID) | ORAL | 0 refills | Status: AC

## 2022-01-09 MED ORDER — HYDROMORPHONE HCL 1 MG/ML IJ SOLN: 0.2500 mg | INTRAMUSCULAR | Status: DC | PRN

## 2022-01-09 MED ORDER — ORAL CARE MOUTH RINSE: 15.0000 mL | Freq: Once | OROMUCOSAL | Status: AC

## 2022-01-09 MED ORDER — BUPIVACAINE-EPINEPHRINE 0.25% -1:200000 IJ SOLN: INTRAMUSCULAR | Status: DC | PRN

## 2022-01-09 MED ORDER — ACETAMINOPHEN 500 MG PO TABS: 1000.0000 mg | ORAL_TABLET | Freq: Three times a day (TID) | ORAL | 0 refills | Status: AC

## 2022-01-09 MED ORDER — ACETAMINOPHEN 325 MG PO TABS: 325.0000 mg | ORAL_TABLET | Freq: Four times a day (QID) | ORAL | Status: DC | PRN

## 2022-01-09 MED ORDER — PROPOFOL 10 MG/ML IV BOLUS
INTRAVENOUS | Status: DC | PRN
  Administered 2022-01-09: 40 mg via INTRAVENOUS

## 2022-01-09 MED ORDER — KETOROLAC TROMETHAMINE 30 MG/ML IJ SOLN
INTRAMUSCULAR | Status: AC
Start: 2022-01-09 — End: ?
  Filled 2022-01-09: qty 1

## 2022-01-09 MED ORDER — KETOROLAC TROMETHAMINE 30 MG/ML IJ SOLN
INTRAMUSCULAR | Status: DC | PRN
  Administered 2022-01-09: 30 mg

## 2022-01-09 MED ORDER — MIDAZOLAM HCL 2 MG/2ML IJ SOLN
INTRAMUSCULAR | Status: AC
  Filled 2022-01-09: qty 2

## 2022-01-09 MED ORDER — PROPOFOL 500 MG/50ML IV EMUL
INTRAVENOUS | Status: AC
  Filled 2022-01-09: qty 50

## 2022-01-09 MED ORDER — HYDROMORPHONE HCL 1 MG/ML IJ SOLN: 1.0000 mg | INTRAMUSCULAR | Status: DC | PRN

## 2022-01-09 MED ORDER — HYDROMORPHONE HCL 1 MG/ML IJ SOLN
INTRAMUSCULAR | Status: AC
  Filled 2022-01-09: qty 1

## 2022-01-09 MED ORDER — ONDANSETRON HCL 4 MG PO TABS: 4.0000 mg | ORAL_TABLET | Freq: Four times a day (QID) | ORAL | Status: DC | PRN

## 2022-01-09 MED ORDER — ACETAMINOPHEN 10 MG/ML IV SOLN
INTRAVENOUS | Status: AC
  Filled 2022-01-09: qty 100

## 2022-01-09 MED ORDER — ISOPROPYL ALCOHOL 70 % SOLN
Status: AC
Start: 2022-01-09 — End: ?
  Filled 2022-01-09: qty 480

## 2022-01-09 MED ORDER — PROPOFOL 1000 MG/100ML IV EMUL
INTRAVENOUS | Status: AC
  Filled 2022-01-09: qty 100

## 2022-01-09 MED ORDER — ONDANSETRON HCL 4 MG/2ML IJ SOLN
INTRAMUSCULAR | Status: DC | PRN
  Administered 2022-01-09: 4 mg via INTRAVENOUS

## 2022-01-09 MED ORDER — ONDANSETRON HCL 4 MG/2ML IJ SOLN
INTRAMUSCULAR | Status: AC
Start: 2022-01-09 — End: ?
  Filled 2022-01-09: qty 2

## 2022-01-09 MED ORDER — PROPOFOL 500 MG/50ML IV EMUL
INTRAVENOUS | Status: DC | PRN
  Administered 2022-01-09: 75 ug/kg/min via INTRAVENOUS

## 2022-01-09 MED ORDER — METOCLOPRAMIDE HCL 5 MG/ML IJ SOLN: 5.0000 mg | Freq: Three times a day (TID) | INTRAMUSCULAR | Status: DC | PRN

## 2022-01-09 MED ORDER — ISOPROPYL ALCOHOL 70 % SOLN
Status: DC | PRN
  Administered 2022-01-09: 1 via TOPICAL

## 2022-01-09 MED ORDER — METHOCARBAMOL 500 MG IVPB - SIMPLE MED
500.0000 mg | Freq: Four times a day (QID) | INTRAVENOUS | Status: DC | PRN
  Administered 2022-01-09: 500 mg via INTRAVENOUS

## 2022-01-09 MED ORDER — DEXAMETHASONE SODIUM PHOSPHATE 10 MG/ML IJ SOLN
INTRAMUSCULAR | Status: DC | PRN
  Administered 2022-01-09: 10 mg via INTRAVENOUS

## 2022-01-09 MED ORDER — LACTATED RINGERS IV BOLUS
250.0000 mL | Freq: Once | INTRAVENOUS | Status: AC
  Administered 2022-01-09: 250 mL via INTRAVENOUS

## 2022-01-09 MED ORDER — POVIDONE-IODINE 10 % EX SWAB: 2.0000 | Freq: Once | CUTANEOUS | Status: DC

## 2022-01-09 MED ORDER — SODIUM CHLORIDE (PF) 0.9 % IJ SOLN
INTRAMUSCULAR | Status: AC
  Filled 2022-01-09: qty 50

## 2022-01-09 MED ORDER — OXYCODONE HCL 5 MG PO TABS
ORAL_TABLET | ORAL | Status: AC
  Filled 2022-01-09: qty 2

## 2022-01-09 MED ORDER — SODIUM CHLORIDE 0.9 % IR SOLN
Status: DC | PRN
  Administered 2022-01-09 (×2): 1000 mL

## 2022-01-09 MED ORDER — BUPIVACAINE IN DEXTROSE 0.75-8.25 % IT SOLN
INTRATHECAL | Status: DC | PRN
  Administered 2022-01-09: 1.8 mL via INTRATHECAL

## 2022-01-09 MED ORDER — HYDROMORPHONE HCL 1 MG/ML IJ SOLN
0.5000 mg | INTRAMUSCULAR | Status: DC | PRN
  Administered 2022-01-09 (×2): 1 mg via INTRAVENOUS

## 2022-01-09 SURGICAL SUPPLY — 74 items
ADH SKN CLS APL DERMABOND .7 (GAUZE/BANDAGES/DRESSINGS) ×1
APL PRP STRL LF DISP 70% ISPRP (MISCELLANEOUS) ×2
BAG COUNTER SPONGE SURGICOUNT (BAG) IMPLANT
BAG SPEC THK2 15X12 ZIP CLS (MISCELLANEOUS) ×1
BAG SPNG CNTER NS LX DISP (BAG) ×1
BAG ZIPLOCK 12X15 (MISCELLANEOUS) IMPLANT
BATTERY INSTRU NAVIGATION (MISCELLANEOUS) ×3 IMPLANT
BLADE SAW RECIPROCATING 77.5 (BLADE) ×1 IMPLANT
BNDG ELASTIC 4X5.8 VLCR STR LF (GAUZE/BANDAGES/DRESSINGS) ×1 IMPLANT
BNDG ELASTIC 6X5.8 VLCR STR LF (GAUZE/BANDAGES/DRESSINGS) ×1 IMPLANT
BTRY SRG DRVR LF (MISCELLANEOUS) ×3
CHLORAPREP W/TINT 26 (MISCELLANEOUS) ×2 IMPLANT
COMP TIB KNEE PS G 0 RT (Joint) ×1 IMPLANT
COMPONENT TIB KNEE PS G 0 RT (Joint) IMPLANT
COVER SURGICAL LIGHT HANDLE (MISCELLANEOUS) ×1 IMPLANT
DERMABOND ADVANCED (GAUZE/BANDAGES/DRESSINGS) ×1
DERMABOND ADVANCED .7 DNX12 (GAUZE/BANDAGES/DRESSINGS) ×2 IMPLANT
DRAPE INCISE IOBAN 66X45 STRL (DRAPES) ×1 IMPLANT
DRAPE SHEET LG 3/4 BI-LAMINATE (DRAPES) ×3 IMPLANT
DRAPE U-SHAPE 47X51 STRL (DRAPES) ×1 IMPLANT
DRSG AQUACEL AG ADV 3.5X10 (GAUZE/BANDAGES/DRESSINGS) IMPLANT
ELECT BLADE TIP CTD 4 INCH (ELECTRODE) ×1 IMPLANT
ELECT REM PT RETURN 15FT ADLT (MISCELLANEOUS) ×1 IMPLANT
GAUZE SPONGE 4X4 12PLY STRL (GAUZE/BANDAGES/DRESSINGS) ×1 IMPLANT
GLOVE BIO SURGEON STRL SZ7 (GLOVE) ×1 IMPLANT
GLOVE BIO SURGEON STRL SZ8.5 (GLOVE) ×2 IMPLANT
GLOVE BIOGEL PI IND STRL 7.5 (GLOVE) ×1 IMPLANT
GLOVE BIOGEL PI IND STRL 8.5 (GLOVE) ×1 IMPLANT
GLOVE BIOGEL PI INDICATOR 7.5 (GLOVE) ×1
GLOVE BIOGEL PI INDICATOR 8.5 (GLOVE) ×1
GOWN SPEC L3 XXLG W/TWL (GOWN DISPOSABLE) ×1 IMPLANT
GOWN STRL REUS W/ TWL XL LVL3 (GOWN DISPOSABLE) ×1 IMPLANT
GOWN STRL REUS W/TWL XL LVL3 (GOWN DISPOSABLE) ×1
GUIDE TASP BOTTOM GH RT +0 (Miscellaneous) IMPLANT
HANDPIECE INTERPULSE COAX TIP (DISPOSABLE) ×1
HDLS TROCR DRIL PIN KNEE 75 (PIN) ×1
HOLDER FOLEY CATH W/STRAP (MISCELLANEOUS) ×1 IMPLANT
HOOD PEEL AWAY FLYTE STAYCOOL (MISCELLANEOUS) ×3 IMPLANT
INSERT TIB ASF PS 7-12 10 GH (Insert) IMPLANT
KIT TURNOVER KIT A (KITS) IMPLANT
MARKER SKIN DUAL TIP RULER LAB (MISCELLANEOUS) ×1 IMPLANT
NDL SAFETY ECLIPSE 18X1.5 (NEEDLE) ×1 IMPLANT
NDL SPNL 18GX3.5 QUINCKE PK (NEEDLE) ×1 IMPLANT
NEEDLE HYPO 18GX1.5 SHARP (NEEDLE) ×1
NEEDLE SPNL 18GX3.5 QUINCKE PK (NEEDLE) ×1 IMPLANT
NS IRRIG 1000ML POUR BTL (IV SOLUTION) ×1 IMPLANT
PACK TOTAL KNEE CUSTOM (KITS) ×1 IMPLANT
PADDING CAST COTTON 6X4 STRL (CAST SUPPLIES) ×1 IMPLANT
PATELLA STD SZ 38X10 (Miscellaneous) IMPLANT
PIN DRILL HDLS TROCAR 75 4PK (PIN) IMPLANT
PROS FEM KNEE PS STD 9 RT (Joint) ×1 IMPLANT
PROSTHESIS FEM KNEE PS STD9 RT (Joint) IMPLANT
PROTECTOR NERVE ULNAR (MISCELLANEOUS) ×1 IMPLANT
SAW OSC TIP CART 19.5X105X1.3 (SAW) ×1 IMPLANT
SCREW FEMALE HEX FIX 25X2.5 (ORTHOPEDIC DISPOSABLE SUPPLIES) IMPLANT
SEALER BIPOLAR AQUA 6.0 (INSTRUMENTS) ×1 IMPLANT
SET HNDPC FAN SPRY TIP SCT (DISPOSABLE) ×1 IMPLANT
SET PAD KNEE POSITIONER (MISCELLANEOUS) ×1 IMPLANT
SOLUTION PRONTOSAN WOUND 350ML (IRRIGATION / IRRIGATOR) IMPLANT
SPIKE FLUID TRANSFER (MISCELLANEOUS) ×2 IMPLANT
SUT MNCRL AB 3-0 PS2 18 (SUTURE) ×1 IMPLANT
SUT MNCRL AB 4-0 PS2 18 (SUTURE) ×1 IMPLANT
SUT MON AB 2-0 CT1 36 (SUTURE) ×1 IMPLANT
SUT STRATAFIX PDO 1 14 VIOLET (SUTURE) ×1
SUT STRATFX PDO 1 14 VIOLET (SUTURE) ×1
SUT VIC AB 1 CTX 36 (SUTURE) ×2
SUT VIC AB 1 CTX36XBRD ANBCTR (SUTURE) ×2 IMPLANT
SUT VIC AB 2-0 CT1 27 (SUTURE) ×1
SUT VIC AB 2-0 CT1 TAPERPNT 27 (SUTURE) ×1 IMPLANT
SUTURE STRATFX PDO 1 14 VIOLET (SUTURE) ×1 IMPLANT
TRAY FOLEY MTR SLVR 16FR STAT (SET/KITS/TRAYS/PACK) IMPLANT
TUBE SUCTION HIGH CAP CLEAR NV (SUCTIONS) ×1 IMPLANT
WATER STERILE IRR 1000ML POUR (IV SOLUTION) ×2 IMPLANT
WRAP KNEE MAXI GEL POST OP (GAUZE/BANDAGES/DRESSINGS) IMPLANT

## 2022-01-09 NOTE — Transfer of Care (Signed)
Immediate Anesthesia Transfer of Care Note  Patient: Phillip Chen  Procedure(s) Performed: COMPUTER ASSISTED TOTAL KNEE ARTHROPLASTY (Right: Knee)  Patient Location: PACU  Anesthesia Type:Spinal  Level of Consciousness: drowsy  Airway & Oxygen Therapy: Patient Spontanous Breathing and Patient connected to face mask oxygen  Post-op Assessment: Report given to RN and Post -op Vital signs reviewed and stable  Post vital signs: Reviewed and stable  Last Vitals:  Vitals Value Taken Time  BP 126/69   Temp    Pulse 92 01/09/22 1106  Resp 12   SpO2 98 % 01/09/22 1106  Vitals shown include unvalidated device data.  Last Pain:  Vitals:   01/09/22 0711  TempSrc:   PainSc: 7       Patients Stated Pain Goal: 5 (00/37/04 8889)  Complications: No notable events documented.

## 2022-01-09 NOTE — Anesthesia Procedure Notes (Signed)
Anesthesia Regional Block: Adductor canal block   Pre-Anesthetic Checklist: , timeout performed,  Correct Patient, Correct Site, Correct Laterality,  Correct Procedure, Correct Position, site marked,  Risks and benefits discussed,  Pre-op evaluation,  At surgeon's request and post-op pain management  Laterality: Right  Prep: Maximum Sterile Barrier Precautions used, chloraprep       Needles:  Injection technique: Single-shot  Needle Type: Echogenic Stimulator Needle     Needle Length: 9cm  Needle Gauge: 21     Additional Needles:   Procedures:,,,, ultrasound used (permanent image in chart),,    Narrative:  Start time: 01/09/2022 7:54 AM End time: 01/09/2022 8:04 AM Injection made incrementally with aspirations every 5 mL.  Performed by: Personally  Anesthesiologist: Roderic Palau, MD  Additional Notes:

## 2022-01-09 NOTE — Interval H&P Note (Signed)
History and Physical Interval Note:  01/09/2022 7:09 AM  Phillip Chen  has presented today for surgery, with the diagnosis of Right knee osteoarthritis.  The various methods of treatment have been discussed with the patient and family. After consideration of risks, benefits and other options for treatment, the patient has consented to  Procedure(s): COMPUTER ASSISTED TOTAL KNEE ARTHROPLASTY (Right) as a surgical intervention.  The patient's history has been reviewed, patient examined, no change in status, stable for surgery.  I have reviewed the patient's chart and labs.  Questions were answered to the patient's satisfaction.     Hilton Cork Seini Lannom

## 2022-01-09 NOTE — Op Note (Signed)
OPERATIVE REPORT  SURGEON: Rod Can, MD   ASSISTANT: Larene Pickett, PA-C  PREOPERATIVE DIAGNOSIS: Primary Right knee arthritis.   POSTOPERATIVE DIAGNOSIS: Primary Right knee arthritis.   PROCEDURE: Computer assisted Right total knee arthroplasty.   IMPLANTS: Zimmer Persona PPS Cementless CR femur, size 9. Persona 0 degree Spiked Keel OsseoTi Tibia, size G. Vivacit-E polyethelyene insert, size 10 mm, CR. TM standard patella, size 38 mm.  ANESTHESIA:  MAC, Regional, and Spinal  TOURNIQUET TIME: Not utilized.   ESTIMATED BLOOD LOSS:-350 mL    ANTIBIOTICS: 2g Ancef.  DRAINS: None.  COMPLICATIONS: None   CONDITION: PACU - hemodynamically stable.   BRIEF CLINICAL NOTE: Phillip Chen is a 46 y.o. male with a long-standing history of Right knee arthritis. After failing conservative management, the patient was indicated for total knee arthroplasty. The risks, benefits, and alternatives to the procedure were explained, and the patient elected to proceed.  PROCEDURE IN DETAIL: Adductor canal block was obtained in the pre-op holding area. Once inside the operative room, spinal anesthesia was obtained, and a foley catheter was inserted. The patient was then positioned and the lower extremity was prepped and draped in the normal sterile surgical fashion.  A time-out was called verifying side and site of surgery. The patient received IV antibiotics within 60 minutes of beginning the procedure. A tourniquet was not utilized.   An anterior approach to the knee was performed utilizing a midvastus arthrotomy. A medial release was performed and the patellar fat pad was excised. Stryker imageless navigation was used to cut the distal femur perpendicular to the mechanical axis. A freehand patellar resection was performed, and the patella was sized an prepared with 3 lug holes.  Nagivation was used to make a neutral proximal tibia resection, taking 9 mm of bone from the less affected lateral side  with 3 degrees of slope. The menisci were excised. A spacer block was placed, and the alignment and balance in extension were confirmed.   The distal femur was sized using the 3-degree external rotation guide referencing the posterior femoral cortex. The appropriate 4-in-1 cutting block was pinned into place. Rotation was checked using Whiteside's line, the epicondylar axis, and then confirmed with a spacer block in flexion. The remaining femoral cuts were performed, taking care to protect the MCL.  The tibia was sized and the trial tray was pinned into place. The remaining trail components were inserted. The knee was stable to varus and valgus stress through a full range of motion. The patella tracked centrally, and the PCL was well balanced. The trial components were removed, and the proximal tibial surface was prepared. Final components were impacted into place. The knee was tested for a final time and found to be well balanced.   The wound was copiously irrigated with Irrisept solution and normal saline using pule lavage.  Marcaine solution was injected into the periarticular soft tissue.  The wound was closed in layers using #1 Vicryl and Stratafix for the fascia, 2-0 Vicryl for the subcutaneous fat, 2-0 Monocryl for the deep dermal layer, 3-0 running Monocryl subcuticular Stitch, and 4-0 Monocryl stay sutures at both ends of the wound. Dermabond was applied to the skin.  Once the glue was fully dried, an Aquacell Ag and compressive dressing were applied.  The patient was transported to the recovery room in stable condition.  Sponge, needle, and instrument counts were correct at the end of the case x2.  The patient tolerated the procedure well and there were no known complications.  Please note that a surgical assistant was a medical necessity for this procedure in order to perform it in a safe and expeditious manner. Surgical assistant was necessary to retract the ligaments and vital neurovascular  structures to prevent injury to them and also necessary for proper positioning of the limb to allow for anatomic placement of the prosthesis.

## 2022-01-09 NOTE — Anesthesia Postprocedure Evaluation (Signed)
Anesthesia Post Note  Patient: ENRIGUE HASHIMI  Procedure(s) Performed: COMPUTER ASSISTED TOTAL KNEE ARTHROPLASTY (Right: Knee)     Patient location during evaluation: PACU Anesthesia Type: Spinal and Regional Level of consciousness: oriented and awake and alert Pain management: pain level controlled Vital Signs Assessment: post-procedure vital signs reviewed and stable Respiratory status: spontaneous breathing and respiratory function stable Cardiovascular status: blood pressure returned to baseline and stable Postop Assessment: no headache, no backache, no apparent nausea or vomiting, spinal receding and patient able to bend at knees Anesthetic complications: no   No notable events documented.  Last Vitals:  Vitals:   01/09/22 1220 01/09/22 1235  BP: (!) 124/91 130/77  Pulse: 86 79  Resp: 15 13  Temp:  36.9 C  SpO2: 95% 94%    Last Pain:  Vitals:   01/09/22 1235  TempSrc:   PainSc: 0-No pain                 Arabelle Bollig,W. EDMOND

## 2022-01-09 NOTE — Evaluation (Signed)
Physical Therapy Evaluation Patient Details Name: Phillip Chen MRN: [id number] DOB: [birth year] Today's Date: 01/09/2022  History of Present Illness  Pt is a 46yo male presenting s/p R-TKA on 01/09/22. PMH: GERD, HTN, OSA on CPAP, SLE recent diagnosis 12/2021, chronic back pain, anterior lumbar fusion L5-S1 2021  Clinical Impression  Phillip Chen is a 46 y.o. male POD 0 s/p R-TKA. Patient reports independence with mobility at baseline. Patient is now limited by functional impairments (see PT problem list below) and requires min guard for transfers and gait with RW. Patient was able to ambulate 80 feet with RW and min guard and cues for safe walker management. Patient educated on safe sequencing for stair mobility and verbalized safe guarding position for people assisting with mobility. Patient instructed in exercises to facilitate ROM and circulation. Patient will benefit from continued skilled PT interventions to address impairments and progress towards PLOF. Patient has met mobility goals at adequate level for discharge home; will continue to follow if pt continues acute stay to progress towards Mod I goals.        Recommendations for follow up therapy are one component of a multi-disciplinary discharge planning process, led by the attending physician.  Recommendations may be updated based on patient status, additional functional criteria and insurance authorization.  Follow Up Recommendations Follow physician's recommendations for discharge plan and follow up therapies      Assistance Recommended at Discharge Set up Supervision/Assistance  Patient can return home with the following  A little help with walking and/or transfers;A little help with bathing/dressing/bathroom;Assistance with cooking/housework;Assist for transportation;Help with stairs or ramp for entrance    Equipment Recommendations Rolling walker (2 wheels)  Recommendations for Other Services       Functional Status Assessment  Patient has had a recent decline in their functional status and demonstrates the ability to make significant improvements in function in a reasonable and predictable amount of time.     Precautions / Restrictions Precautions Precautions: Fall Restrictions Weight Bearing Restrictions: No Other Position/Activity Restrictions: wbat      Mobility  Bed Mobility Overal bed mobility: Modified Independent             General bed mobility comments: increased time    Transfers Overall transfer level: Needs assistance Equipment used: Rolling walker (2 wheels) Transfers: Sit to/from Stand Sit to Stand: Min guard, From elevated surface           General transfer comment: For safety only from stretcher, no physical assist required, VCs for sequencing    Ambulation/Gait Ambulation/Gait assistance: Min guard Gait Distance (Feet): 80 Feet Assistive device: Rolling walker (2 wheels) Gait Pattern/deviations: Step-to pattern Gait velocity: decreased     General Gait Details: Pt ambulated with RW and min guard 36f, no physical assist or overt LOB noted. VCs for smooth gait  Stairs Stairs: Yes Stairs assistance: Min guard Stair Management: One rail Right, Step to pattern, Sideways Number of Stairs: 2 General stair comments: Pt educated on sideways stair mobility, reported "that's how I've been doing it." Demonstrated safe technique with min guard and no overt LOB, VCs for sequencing.  Wheelchair Mobility    Modified Rankin (Stroke Patients Only)       Balance Overall balance assessment: Needs assistance Sitting-balance support: No upper extremity supported, Feet supported Sitting balance-Leahy Scale: Good     Standing balance support: Reliant on assistive device for balance, During functional activity, Bilateral upper extremity supported Standing balance-Leahy Scale: Poor  Pertinent Vitals/Pain Pain Assessment Pain  Assessment: 0-10 Pain Score: 3  Pain Location: back, right knee Pain Descriptors / Indicators: Operative site guarding, Discomfort Pain Intervention(s): Limited activity within patient's tolerance, Monitored during session, Repositioned, Ice applied    Home Living Family/patient expects to be discharged to:: Private residence Living Arrangements: Spouse/significant other Available Help at Discharge: Family;Available 24 hours/day Type of Home: House Home Access: Stairs to enter Entrance Stairs-Rails: Psychiatric nurse of Steps: 5   Home Layout: One level Home Equipment: None      Prior Function Prior Level of Function : Independent/Modified Independent;Driving;History of Falls (last six months) (Pt slipped and fell on a log and cracked a rib and had gallbladder surgery over a month ago.)             Mobility Comments: ind ADLs Comments: IND     Hand Dominance   Dominant Hand: Right    Extremity/Trunk Assessment        Lower Extremity Assessment Lower Extremity Assessment: RLE deficits/detail;LLE deficits/detail RLE Deficits / Details: MMT ank DF/PF 5/5, no extensor lag noted RLE Sensation: WNL LLE Deficits / Details: MMT ank DF/PF 5/5 LLE Sensation: WNL    Cervical / Trunk Assessment Cervical / Trunk Assessment: Back Surgery  Communication   Communication: No difficulties  Cognition Arousal/Alertness: Awake/alert Behavior During Therapy: WFL for tasks assessed/performed Overall Cognitive Status: Within Functional Limits for tasks assessed                                          General Comments General comments (skin integrity, edema, etc.): Wife and mother present for session    Exercises Total Joint Exercises Ankle Circles/Pumps: AROM, Both, 10 reps, Supine Quad Sets: AROM, Right, Other reps (comment) (2) Short Arc Quad: AROM, Right, Other reps (comment), Supine (2) Heel Slides: AAROM, Right, 5 reps, Supine Hip  ABduction/ADduction: AROM, Right, Other reps (comment) (3) Straight Leg Raises: AAROM, Right, Other reps (comment), Supine (3) Long Arc Quad: Other (comment), Seated, AROM (educated not performed) Knee Flexion: Other (comment), PROM, AROM, Seated (educated not performed) Goniometric ROM: ~0-90 by visual assessment   Assessment/Plan    PT Assessment Patient needs continued PT services  PT Problem List Decreased strength;Decreased range of motion;Decreased activity tolerance;Decreased balance;Decreased mobility;Decreased coordination;Decreased knowledge of use of DME;Pain       PT Treatment Interventions DME instruction;Gait training;Stair training;Functional mobility training;Therapeutic activities;Therapeutic exercise;Balance training;Neuromuscular re-education;Patient/family education    PT Goals (Current goals can be found in the Care Plan section)  Acute Rehab PT Goals Patient Stated Goal: walk without pain PT Goal Formulation: With patient Time For Goal Achievement: 01/16/22 Potential to Achieve Goals: Good    Frequency 7X/week     Co-evaluation               AM-PAC PT "6 Clicks" Mobility  Outcome Measure Help needed turning from your back to your side while in a flat bed without using bedrails?: None Help needed moving from lying on your back to sitting on the side of a flat bed without using bedrails?: None Help needed moving to and from a bed to a chair (including a wheelchair)?: A Little Help needed standing up from a chair using your arms (e.g., wheelchair or bedside chair)?: A Little Help needed to walk in hospital room?: A Little Help needed climbing 3-5 steps with a railing? : A Little 6 Click  Score: 20    End of Session Equipment Utilized During Treatment: Gait belt Activity Tolerance: Patient tolerated treatment well;No increased pain Patient left: in chair;with call bell/phone within reach;with family/visitor present Nurse Communication: Mobility  status PT Visit Diagnosis: Pain;Difficulty in walking, not elsewhere classified (R26.2) Pain - Right/Left: Right Pain - part of body: Knee    Time: 1430-1506 PT Time Calculation (min) (ACUTE ONLY): 36 min   Charges:   PT Evaluation $PT Eval Low Complexity: 1 Low PT Treatments $Gait Training: 8-22 mins        Coolidge Breeze, PT, DPT [facility] Office: [phone] Pager: [phone]  Coolidge Breeze 01/09/2022, 3:11 PM

## 2022-01-09 NOTE — Discharge Instructions (Signed)
 Dr. Brooklinn Longbottom Total Joint Specialist [facility] [address] [place], [place] [address] [phone]  TOTAL KNEE REPLACEMENT POSTOPERATIVE DIRECTIONS    Knee Rehabilitation, Guidelines Following Surgery  Results after knee surgery are often greatly improved when you follow the exercise, range of motion and muscle strengthening exercises prescribed by your doctor. Safety measures are also important to protect the knee from further injury. Any time any of these exercises cause you to have increased pain or swelling in your knee joint, decrease the amount until you are comfortable again and slowly increase them. If you have problems or questions, call your caregiver or physical therapist for advice.   WEIGHT BEARING Weight bearing as tolerated with assist device (walker, cane, etc) as directed, use it as long as suggested by your surgeon or therapist, typically at least 4-6 weeks.  HOME CARE INSTRUCTIONS  Remove items at home which could result in a fall. This includes throw rugs or furniture in walking pathways.  Continue medications as instructed at time of discharge. You may have some home medications which will be placed on hold until you complete the course of blood thinner medication.  You may start showering once you are discharged home but do not submerge the incision under water. Just pat the incision dry and apply a dry gauze dressing on daily. Walk with walker as instructed.  You may resume a sexual relationship in one month or when given the OK by your doctor.  Use walker as long as suggested by your caregivers. Avoid periods of inactivity such as sitting longer than an hour when not asleep. This helps prevent blood clots.  You may put full weight on your legs and walk as much as is comfortable.  You may return to work once you are cleared by your doctor.  Do not drive a car for 6 weeks or until released by you surgeon.  Do not drive while  taking narcotics.  Wear the elastic stockings for three weeks following surgery during the day but you may remove then at night. Make sure you keep all of your appointments after your operation with all of your doctors and caregivers. You should call the office at the above phone number and make an appointment for approximately two weeks after the date of your surgery. Do not remove your surgical dressing. The dressing is waterproof; you may take showers in 3 days, but do not take tub baths or submerge the dressing. Please pick up a stool softener and laxative for home use as long as you are requiring pain medications. ICE to the affected knee every three hours for 30 minutes at a time and then as needed for pain and swelling.  Continue to use ice on the knee for pain and swelling from surgery. You may notice swelling that will progress down to the foot and ankle.  This is normal after surgery.  Elevate the leg when you are not up walking on it.   It is important for you to complete the blood thinner medication as prescribed by your doctor. Continue to use the breathing machine which will help keep your temperature down.  It is common for your temperature to cycle up and down following surgery, especially at night when you are not up moving around and exerting yourself.  The breathing machine keeps your lungs expanded and your temperature down.  RANGE OF MOTION AND STRENGTHENING EXERCISES  Rehabilitation of the knee is important following a knee injury or an   operation. After just a few days of immobilization, the muscles of the thigh which control the knee become weakened and shrink (atrophy). Knee exercises are designed to build up the tone and strength of the thigh muscles and to improve knee motion. Often times heat used for twenty to thirty minutes before working out will loosen up your tissues and help with improving the range of motion but do not use heat for the first two weeks following surgery.  These exercises can be done on a training (exercise) mat, on the floor, on a table or on a bed. Use what ever works the best and is most comfortable for you Knee exercises include:  Leg Lifts - While your knee is still immobilized in a splint or cast, you can do straight leg raises. Lift the leg to 60 degrees, hold for 3 sec, and slowly lower the leg. Repeat 10-20 times 2-3 times daily. Perform this exercise against resistance later as your knee gets better.  Quad and Hamstring Sets - Tighten up the muscle on the front of the thigh (Quad) and hold for 5-10 sec. Repeat this 10-20 times hourly. Hamstring sets are done by pushing the foot backward against an object and holding for 5-10 sec. Repeat as with quad sets.  A rehabilitation program following serious knee injuries can speed recovery and prevent re-injury in the future due to weakened muscles. Contact your doctor or a physical therapist for more information on knee rehabilitation.   POST-OPERATIVE OPIOID TAPER INSTRUCTIONS: It is important to wean off of your opioid medication as soon as possible. If you do not need pain medication after your surgery it is ok to stop day one. Opioids include: Codeine, Hydrocodone(Norco, Vicodin), Oxycodone(Percocet, oxycontin) and hydromorphone amongst others.  Long term and even short term use of opiods can cause: Increased pain response Dependence Constipation Depression Respiratory depression And more.  Withdrawal symptoms can include Flu like symptoms Nausea, vomiting And more Techniques to manage these symptoms Hydrate well Eat regular healthy meals Stay active Use relaxation techniques(deep breathing, meditating, yoga) Do Not substitute Alcohol to help with tapering If you have been on opioids for less than two weeks and do not have pain than it is ok to stop all together.  Plan to wean off of opioids This plan should start within one week post op of your joint replacement. Maintain the same  interval or time between taking each dose and first decrease the dose.  Cut the total daily intake of opioids by one tablet each day Next start to increase the time between doses. The last dose that should be eliminated is the evening dose.    SKILLED REHAB INSTRUCTIONS: If the patient is transferred to a skilled rehab facility following release from the hospital, a list of the current medications will be sent to the facility for the patient to continue.  When discharged from the skilled rehab facility, please have the facility set up the patient's Home Health Physical Therapy prior to being released. Also, the skilled facility will be responsible for providing the patient with their medications at time of release from the facility to include their pain medication, the muscle relaxants, and their blood thinner medication. If the patient is still at the rehab facility at time of the two week follow up appointment, the skilled rehab facility will also need to assist the patient in arranging follow up appointment in our office and any transportation needs.  MAKE SURE YOU:  Understand these instructions.  Will watch   your condition.  Will get help right away if you are not doing well or get worse.    Pick up stool softner and laxative for home use following surgery while on pain medications. Do NOT remove your dressing. You may shower.  Do not take tub baths or submerge incision under water. May shower starting three days after surgery. Please use a clean towel to pat the incision dry following showers. Continue to use ice for pain and swelling after surgery. Do not use any lotions or creams on the incision until instructed by your surgeon.  

## 2022-01-09 NOTE — Anesthesia Procedure Notes (Signed)
Spinal  Patient location during procedure: OR Start time: 01/09/2022 8:40 AM End time: 01/09/2022 8:42 AM Reason for block: surgical anesthesia Staffing Performed: resident/CRNA  Resident/CRNA: [place] Oisin Yoakum C, CRNA Performed by: [place] Aleyda Gindlesperger C, CRNA Authorized by: Roderic Palau, MD   Preanesthetic Checklist Completed: patient identified, IV checked, site marked, risks and benefits discussed, surgical consent, monitors and equipment checked, pre-op evaluation and timeout performed Spinal Block Patient position: sitting Prep: DuraPrep and site prepped and draped Patient monitoring: heart rate, cardiac monitor, continuous pulse ox and blood pressure Approach: midline Location: L3-4 Injection technique: single-shot Needle Needle type: Pencan  Needle gauge: 24 G Needle length: 9 cm Assessment Sensory level: T4 Events: CSF return Additional Notes IV functioning, monitors applied to pt. Expiration date of kit checked and confirmed to be in date. Sterile prep and drape, hand hygiene and sterile gloved used. Pt was positioned and spine was prepped in sterile fashion. Skin was anesthetized with lidocaine. Free flow of clear CSF obtained prior to injecting local anesthetic into CSF x 1 attempt. Spinal needle aspirated freely following injection. Needle was carefully withdrawn, and pt tolerated procedure well. Loss of motor and sensory on exam post injection.

## 2022-01-11 ENCOUNTER — Emergency Department (HOSPITAL_COMMUNITY)
Admission: EM | Admit: 2022-01-11 | Discharge: 2022-01-11 | Disposition: A | Discharge status: Home / Self Care | Payer: BC Managed Care – PPO | Attending: Emergency Medicine | Admitting: Emergency Medicine

## 2022-01-11 ENCOUNTER — Emergency Department (HOSPITAL_BASED_OUTPATIENT_CLINIC_OR_DEPARTMENT_OTHER): Payer: BC Managed Care – PPO

## 2022-01-11 ENCOUNTER — Other Ambulatory Visit: Payer: Self-pay

## 2022-01-11 DIAGNOSIS — D649 Anemia, unspecified: Secondary | ICD-10-CM | POA: Insufficient documentation

## 2022-01-11 DIAGNOSIS — Z7982 Long term (current) use of aspirin: Secondary | ICD-10-CM | POA: Insufficient documentation

## 2022-01-11 DIAGNOSIS — D72829 Elevated white blood cell count, unspecified: Secondary | ICD-10-CM | POA: Diagnosis not present

## 2022-01-11 DIAGNOSIS — Z79899 Other long term (current) drug therapy: Secondary | ICD-10-CM | POA: Insufficient documentation

## 2022-01-11 DIAGNOSIS — I10 Essential (primary) hypertension: Secondary | ICD-10-CM

## 2022-01-11 DIAGNOSIS — M25561 Pain in right knee: Secondary | ICD-10-CM

## 2022-01-11 DIAGNOSIS — M7989 Other specified soft tissue disorders: Secondary | ICD-10-CM | POA: Insufficient documentation

## 2022-01-11 DIAGNOSIS — Z96651 Presence of right artificial knee joint: Secondary | ICD-10-CM | POA: Diagnosis not present

## 2022-01-11 LAB — URINALYSIS, ROUTINE W REFLEX MICROSCOPIC
Bilirubin Urine: NEGATIVE
Glucose, UA: NEGATIVE mg/dL
Hgb urine dipstick: NEGATIVE
Ketones, ur: NEGATIVE mg/dL
Leukocytes,Ua: NEGATIVE
Nitrite: NEGATIVE
Protein, ur: NEGATIVE mg/dL
Specific Gravity, Urine: 1.016 (ref 1.005–1.030)
pH: 7 (ref 5.0–8.0)

## 2022-01-11 LAB — BASIC METABOLIC PANEL
Anion gap: 6 (ref 5–15)
BUN: 15 mg/dL (ref 6–20)
CO2: 27 mmol/L (ref 22–32)
Calcium: 8.3 mg/dL — ABNORMAL LOW (ref 8.9–10.3)
Chloride: 107 mmol/L (ref 98–111)
Creatinine, Ser: 0.81 mg/dL (ref 0.61–1.24)
GFR, Estimated: >60 mL/min (ref >60)
Glucose, Bld: 134 mg/dL — ABNORMAL HIGH (ref 70–99)
Potassium: 4 mmol/L (ref 3.5–5.1)
Sodium: 140 mmol/L (ref 135–145)

## 2022-01-11 LAB — CBC WITH DIFFERENTIAL/PLATELET
Abs Immature Granulocytes: 0.38 10*3/uL — ABNORMAL HIGH (ref 0.00–0.07)
Basophils Absolute: 0.2 10*3/uL — ABNORMAL HIGH (ref 0.0–0.1)
Basophils Relative: 1 %
Eosinophils Absolute: 0 10*3/uL (ref 0.0–0.5)
Eosinophils Relative: 0 %
HCT: 35.5 % — ABNORMAL LOW (ref 39.0–52.0)
Hemoglobin: 12.2 g/dL — ABNORMAL LOW (ref 13.0–17.0)
Immature Granulocytes: 3 %
Lymphocytes Relative: 20 %
Lymphs Abs: 2.4 10*3/uL (ref 0.7–4.0)
MCH: 31.4 pg (ref 26.0–34.0)
MCHC: 34.4 g/dL (ref 30.0–36.0)
MCV: 91.5 fL (ref 80.0–100.0)
Monocytes Absolute: 1.2 10*3/uL — ABNORMAL HIGH (ref 0.1–1.0)
Monocytes Relative: 10 %
Neutro Abs: 7.8 10*3/uL — ABNORMAL HIGH (ref 1.7–7.7)
Neutrophils Relative %: 66 %
Platelets: 228 10*3/uL (ref 150–400)
RBC: 3.88 MIL/uL — ABNORMAL LOW (ref 4.22–5.81)
RDW: 12.5 % (ref 11.5–15.5)
WBC: 11.9 10*3/uL — ABNORMAL HIGH (ref 4.0–10.5)
nRBC: 0 % (ref 0.0–0.2)

## 2022-01-11 MED ORDER — HYDROMORPHONE HCL 1 MG/ML IJ SOLN
1.0000 mg | Freq: Once | INTRAMUSCULAR | Status: AC
  Administered 2022-01-11: 1 mg via INTRAVENOUS
  Filled 2022-01-11: qty 1

## 2022-01-11 NOTE — Progress Notes (Signed)
Right lower extremity venous duplex has been completed. Preliminary results can be found in CV Proc through chart review.  Results were given to Dr. Ashok Cordia.  01/11/22 9:24 AM Phillip Chen RVT

## 2022-01-11 NOTE — ED Triage Notes (Signed)
Patient coming to ED for evaluation of increased pain and swelling to R knee s/p total knee replacement.  Reports pain is uncontrolled with Rx medications.  C/o swelling to calf and decreased sensation in foot

## 2022-01-11 NOTE — ED Notes (Signed)
Ice pack provided to patient per request

## 2022-01-11 NOTE — Discharge Instructions (Signed)
It was our pleasure to provide your ER care today - we hope that you feel better.  When not up and about, keep right leg elevated, above level of heart, as much as possible, in order to help with swelling.  Icepacks/coldpacks to sore area. Do PT activities as advised by your orthopedist.  Take your pain medication as need.   Follow up closely with your orthopedist.   Your blood pressure is high - continue meds and follow up with primary care doctor in the next couple weeks.   Return to ER if worse, new symptoms, fevers, spreading redness, intractable pain, numbness/weakness, or other concern.

## 2022-01-11 NOTE — ED Provider Notes (Signed)
[facility] DEPT Provider Note   CSN: [id number] Arrival date & time: 01/11/22  3762     History  Chief Complaint  Patient presents with   Post-op Problem    Phillip Chen is a 46 y.o. male.  Patient s/p right TKA 2 days ago, presents with swelling to RLE and pain in/around knee not controlled with home med.  Symptoms acute onset, mod-severe, persistent. On asa q day, no other anticoagulant use. Denies fever/chills. No bleeding or drainage from wound. No chest pain or sob.   The history is provided by the patient, the spouse and medical records.       Home Medications Prior to Admission medications   Medication Sig Start Date End Date Taking? Authorizing Provider  acetaminophen (TYLENOL) 500 MG tablet Take 2 tablets (1,000 mg total) by mouth every 8 (eight) hours. 01/09/22 02/08/22  Swinteck, Aaron Edelman, MD  albuterol (VENTOLIN HFA) 108 (90 Base) MCG/ACT inhaler Inhale 1-2 puffs into the lungs every 6 (six) hours as needed for wheezing or shortness of breath.    [provider]  aspirin (ASPIRIN CHILDRENS) 81 MG chewable tablet Chew 1 tablet (81 mg total) by mouth 2 (two) times daily with a meal. 01/09/22 02/23/22  Swinteck, Aaron Edelman, MD  docusate sodium (COLACE) 100 MG capsule Take 1 capsule (100 mg total) by mouth 2 (two) times daily. 01/09/22 03/10/22  Swinteck, Aaron Edelman, MD  DULoxetine (CYMBALTA) 60 MG capsule Take 60 mg by mouth in the morning. 07/05/21   [provider]  fluticasone-salmeterol (ADVAIR) 100-50 MCG/ACT AEPB Inhale 1 puff into the lungs in the morning.    [provider]  gabapentin (NEURONTIN) 600 MG tablet Take 600 mg by mouth in the morning and at bedtime.    [provider]  lisinopril-hydrochlorothiazide (ZESTORETIC) 20-25 MG tablet Take 1 tablet by mouth in the morning. 04/17/19   [provider]  LORazepam (ATIVAN) 1 MG tablet Take 1 mg by mouth 2 (two) times daily as needed for anxiety.     [provider]  meloxicam (MOBIC) 15 MG tablet Take 1 tablet (15 mg total) by mouth daily. 01/09/22 04/09/22  Swinteck, Aaron Edelman, MD  ondansetron (ZOFRAN) 4 MG tablet Take 1 tablet (4 mg total) by mouth every 8 (eight) hours as needed for nausea or vomiting. 01/09/22   Swinteck, Aaron Edelman, MD  oxyCODONE (ROXICODONE) 5 MG immediate release tablet Take 1 tablet (5 mg total) by mouth every 4 (four) hours as needed for up to 7 days for severe pain. 01/09/22 01/16/22  Swinteck, Aaron Edelman, MD  pantoprazole (PROTONIX) 40 MG tablet Take 40 mg by mouth at bedtime.    [provider]  polyethylene glycol (MIRALAX) 17 g packet Take 17 g by mouth daily as needed for moderate constipation or severe constipation. 01/09/22   Swinteck, Aaron Edelman, MD  senna (SENOKOT) 8.6 MG TABS tablet Take 2 tablets (17.2 mg total) by mouth at bedtime. 01/09/22 03/10/22  Swinteck, Aaron Edelman, MD      Allergies    Penicillins and Sulfa antibiotics    Review of Systems   Review of Systems  Constitutional:  Negative for chills, diaphoresis and fever.  Respiratory:  Negative for shortness of breath.   Cardiovascular:  Negative for chest pain.  Musculoskeletal:        Pain to right knee  Neurological:  Negative for weakness and numbness.    Physical Exam Updated Vital Signs BP (!) 160/97   Pulse 72   Temp 98 F (36.7 C) (  Oral)   Resp 17   SpO2 97%  Physical Exam Vitals and nursing note reviewed.  Constitutional:      Appearance: Normal appearance. He is well-developed.  HENT:     Head: Atraumatic.     Nose: Nose normal.     Mouth/Throat:     Mouth: Mucous membranes are moist.  Eyes:     General: No scleral icterus.    Conjunctiva/sclera: Conjunctivae normal.  Neck:     Trachea: No tracheal deviation.  Cardiovascular:     Rate and Rhythm: Normal rate.     Pulses: Normal pulses.  Pulmonary:     Effort: Pulmonary effort is normal. No accessory muscle usage or respiratory distress.  Abdominal:     General: There is  no distension.  Genitourinary:    Comments: No cva tenderness. Musculoskeletal:     Cervical back: Neck supple.     Comments: Moderate swelling to RLE from proximal thigh to ankle. Distal pulses palp, 2+. Foot is of normal color and warmth. Normal cap refill in toes. Compartments for RLE are soft, not tense, not woody.  Dressing intact. No purulent drainage or bleeding noted. No crepitus or devitalized tissue noted.   Skin:    General: Skin is warm and dry.     Findings: No rash.  Neurological:     Mental Status: He is alert.     Comments: Alert, speech clear. RLE nvi w intact motor/sens fxn.   Psychiatric:        Mood and Affect: Mood normal.     ED Results / Procedures / Treatments   Labs (all labs ordered are listed, but only abnormal results are displayed) Results for orders placed or performed during the hospital encounter of 01/11/22  CBC with Differential  Result Value Ref Range   WBC 11.9 (H) 4.0 - 10.5 K/uL   RBC 3.88 (L) 4.22 - 5.81 MIL/uL   Hemoglobin 12.2 (L) 13.0 - 17.0 g/dL   HCT 35.5 (L) 39.0 - 52.0 %   MCV 91.5 80.0 - 100.0 fL   MCH 31.4 26.0 - 34.0 pg   MCHC 34.4 30.0 - 36.0 g/dL   RDW 12.5 11.5 - 15.5 %   Platelets 228 150 - 400 K/uL   nRBC 0.0 0.0 - 0.2 %   Neutrophils Relative % 66 %   Neutro Abs 7.8 (H) 1.7 - 7.7 K/uL   Lymphocytes Relative 20 %   Lymphs Abs 2.4 0.7 - 4.0 K/uL   Monocytes Relative 10 %   Monocytes Absolute 1.2 (H) 0.1 - 1.0 K/uL   Eosinophils Relative 0 %   Eosinophils Absolute 0.0 0.0 - 0.5 K/uL   Basophils Relative 1 %   Basophils Absolute 0.2 (H) 0.0 - 0.1 K/uL   Immature Granulocytes 3 %   Abs Immature Granulocytes 0.38 (H) 0.00 - 0.07 K/uL  Basic metabolic panel  Result Value Ref Range   Sodium 140 135 - 145 mmol/L   Potassium 4.0 3.5 - 5.1 mmol/L   Chloride 107 98 - 111 mmol/L   CO2 27 22 - 32 mmol/L   Glucose, Bld 134 (H) 70 - 99 mg/dL   BUN 15 6 - 20 mg/dL   Creatinine, Ser 0.81 0.61 - 1.24 mg/dL   Calcium 8.3 (L) 8.9  - 10.3 mg/dL   GFR, Estimated >60 >60 mL/min   Anion gap 6 5 - 15  Urinalysis, Routine w reflex microscopic Urine, Clean Catch  Result Value Ref Range   Color, Urine  YELLOW YELLOW   APPearance CLOUDY (A) CLEAR   Specific Gravity, Urine 1.016 1.005 - 1.030   pH 7.0 5.0 - 8.0   Glucose, UA NEGATIVE NEGATIVE mg/dL   Hgb urine dipstick NEGATIVE NEGATIVE   Bilirubin Urine NEGATIVE NEGATIVE   Ketones, ur NEGATIVE NEGATIVE mg/dL   Protein, ur NEGATIVE NEGATIVE mg/dL   Nitrite NEGATIVE NEGATIVE   Leukocytes,Ua NEGATIVE NEGATIVE   DG Knee Right Port  Result Date: 01/09/2022 CLINICAL DATA:  46 year old male status post right total knee arthroplasty. EXAM: PORTABLE RIGHT KNEE - 1-2 VIEW COMPARISON:  None Available. FINDINGS: Immediate postsurgical changes after right total knee arthroplasty with tibial, femoral, and patellar components well seated without evidence of hardware loosening, fracture, or malalignment. Pneumarthrosis. No fracture. IMPRESSION: Immediate postsurgical changes after right total knee arthroplasty without complicating features. Electronically Signed   By: Ruthann Cancer M.D.   On: 01/09/2022 11:34      EKG None  Radiology VAS [place] LOWER EXTREMITY VENOUS (DVT) (7a-7p)  Result Date: 01/11/2022  Lower Venous DVT Study Patient Name:  HAIK MAHONEY  Date of Exam:   01/11/2022 Medical Rec #: [id number]      Accession #:    [id number] Date of Birth: February 02, 1976       Patient Gender: M Patient Age:   19 years Exam Location:  [facility] Procedure:      VAS [place] LOWER EXTREMITY VENOUS (DVT) Referring Phys: Lajean Saver --------------------------------------------------------------------------------  Indications: Swelling.  Risk Factors: Surgery. Limitations: Body habitus and poor ultrasound/tissue interface. Comparison Study: No prior studies. Performing Technologist: Oliver Hum RVT  Examination Guidelines: A complete evaluation includes B-mode imaging, spectral Doppler, color  Doppler, and power Doppler as needed of all accessible portions of each vessel. Bilateral testing is considered an integral part of a complete examination. Limited examinations for reoccurring indications may be performed as noted. The reflux portion of the exam is performed with the patient in reverse Trendelenburg.  +---------+---------------+---------+-----------+----------+--------------+ RIGHT    CompressibilityPhasicitySpontaneityPropertiesThrombus Aging +---------+---------------+---------+-----------+----------+--------------+ CFV      Full           Yes      Yes                                 +---------+---------------+---------+-----------+----------+--------------+ SFJ      Full                                                        +---------+---------------+---------+-----------+----------+--------------+ FV Prox  Full                                                        +---------+---------------+---------+-----------+----------+--------------+ FV Mid                  Yes      Yes                                 +---------+---------------+---------+-----------+----------+--------------+ FV Distal               Yes  Yes                                 +---------+---------------+---------+-----------+----------+--------------+ PFV      Full                                                        +---------+---------------+---------+-----------+----------+--------------+ POP      Full           Yes      Yes                                 +---------+---------------+---------+-----------+----------+--------------+ PTV      Full                                                        +---------+---------------+---------+-----------+----------+--------------+ PERO     Full                                                        +---------+---------------+---------+-----------+----------+--------------+ Gastroc  Full                                                         +---------+---------------+---------+-----------+----------+--------------+   +----+---------------+---------+-----------+----------+--------------+ LEFTCompressibilityPhasicitySpontaneityPropertiesThrombus Aging +----+---------------+---------+-----------+----------+--------------+ CFV Full           Yes      Yes                                 +----+---------------+---------+-----------+----------+--------------+    Summary: RIGHT: - There is no evidence of deep vein thrombosis in the lower extremity. However, portions of this examination were limited- see technologist comments above.  - No cystic structure found in the popliteal fossa.  LEFT: - No evidence of common femoral vein obstruction.  *See table(s) above for measurements and observations.    Preliminary    DG Knee Right Port  Result Date: 01/09/2022 CLINICAL DATA:  46 year old male status post right total knee arthroplasty. EXAM: PORTABLE RIGHT KNEE - 1-2 VIEW COMPARISON:  None Available. FINDINGS: Immediate postsurgical changes after right total knee arthroplasty with tibial, femoral, and patellar components well seated without evidence of hardware loosening, fracture, or malalignment. Pneumarthrosis. No fracture. IMPRESSION: Immediate postsurgical changes after right total knee arthroplasty without complicating features. Electronically Signed   By: Ruthann Cancer M.D.   On: 01/09/2022 11:34    Procedures Procedures    Medications Ordered in ED Medications  HYDROmorphone (DILAUDID) injection 1 mg (has no administration in time range)    ED Course/ Medical Decision Making/ A&P  Medical Decision Making Problems Addressed: Acute pain of right knee: acute illness or injury with systemic symptoms Essential hypertension: chronic illness or injury with exacerbation, progression, or side effects of treatment that poses a threat to life or bodily  functions  Amount and/or Complexity of Data Reviewed Independent Historian: spouse    Details: hx External Data Reviewed: notes. Labs: ordered. Decision-making details documented in ED Course. Radiology: ordered and independent interpretation performed. Decision-making details documented in ED Course.  Risk Prescription drug management. Parenteral controlled substances. Decision regarding hospitalization.   Iv ns. Continuous pulse ox and cardiac monitoring. Labs ordered/sent. Imaging ordered.   Diff dx incl post operative pain/swelling, dvt, etc. - dispo decision including potential need for admission if extensive dvt considered - will get labs and imaging and reassess.   Reviewed nursing notes and prior charts for additional history. External reports reviewed. Additional history from:spouse  Cardiac monitor: sinus rhythm, rate 72.  Labs reviewed/interpreted by me - hgb 12.2, wbc 11.9.   U/S reviewed/interpreted by me - no dvt.   Dilaudid iv. Elevate extremity, icepack.   Recheck, pain is improved/controlled (no 'pain out of proportion').     Discussed labs/imaging w pt.   Pt currently appears stable for d/c.   Return precautions provided.          Final Clinical Impression(s) / ED Diagnoses Final diagnoses:  None    Rx / DC Orders ED Discharge Orders     None         Lajean Saver, MD 01/11/22 [phone]

## 2022-01-16 ENCOUNTER — Encounter (HOSPITAL_COMMUNITY): Payer: Self-pay | Admitting: Orthopedic Surgery

## 2022-03-14 ENCOUNTER — Other Ambulatory Visit (HOSPITAL_COMMUNITY): Payer: Self-pay | Admitting: RHEUMATOLOGY

## 2022-03-14 DIAGNOSIS — M461 Sacroiliitis, not elsewhere classified: Secondary | ICD-10-CM

## 2022-03-25 ENCOUNTER — Other Ambulatory Visit: Payer: Self-pay

## 2022-03-25 ENCOUNTER — Inpatient Hospital Stay
Admission: RE | Admit: 2022-03-25 | Discharge: 2022-03-25 | Disposition: A | Discharge status: Home / Self Care | Payer: BC Managed Care – PPO | Source: Ambulatory Visit | Attending: RHEUMATOLOGY | Admitting: RHEUMATOLOGY

## 2022-03-25 DIAGNOSIS — M461 Sacroiliitis, not elsewhere classified: Secondary | ICD-10-CM | POA: Insufficient documentation

## 2022-03-25 MED ORDER — GADOBUTROL 10 MMOL/10 ML (1 MMOL/ML) INTRAVENOUS SOLUTION
10.0000 mL | INTRAVENOUS | Status: AC
Start: 2022-03-25 — End: 2022-03-25
  Administered 2022-03-25: 10 mL via INTRAVENOUS

## 2022-04-05 ENCOUNTER — Encounter (INDEPENDENT_AMBULATORY_CARE_PROVIDER_SITE_OTHER): Payer: BC Managed Care – PPO | Admitting: Surgery

## 2022-04-12 ENCOUNTER — Encounter (INDEPENDENT_AMBULATORY_CARE_PROVIDER_SITE_OTHER): Payer: Self-pay | Admitting: Surgery

## 2022-06-19 ENCOUNTER — Other Ambulatory Visit: Payer: Self-pay

## 2022-06-19 ENCOUNTER — Ambulatory Visit (INDEPENDENT_AMBULATORY_CARE_PROVIDER_SITE_OTHER): Payer: BC Managed Care – PPO | Admitting: Surgery

## 2022-06-19 ENCOUNTER — Encounter (INDEPENDENT_AMBULATORY_CARE_PROVIDER_SITE_OTHER): Payer: Self-pay | Admitting: Surgery

## 2022-06-19 VITALS — BP 163/99 | HR 95 | Ht 70.0 in | Wt 262.0 lb

## 2022-06-19 DIAGNOSIS — Z860100 Personal history of colon polyps, unspecified: Secondary | ICD-10-CM | POA: Insufficient documentation

## 2022-06-19 DIAGNOSIS — Z800 Family history of malignant neoplasm of digestive organs: Secondary | ICD-10-CM

## 2022-06-19 MED ORDER — SODIUM,POTASSIUM,MAG SULFATES 17.5 GRAM-3.13 GRAM-1.6 GRAM ORAL SOLN
1.0000 | Freq: Two times a day (BID) | ORAL | 0 refills | Status: DC
Start: 2022-06-19 — End: 2022-08-26

## 2022-06-19 NOTE — Progress Notes (Signed)
[facility]AL [facility], [place]  [address]  [place] [address]  Phone: [phone]  Fax: [phone]    Encounter Date: 06/19/2022    Patient ID:  Gevork Ayyad  EXB:[id number]    DOB: 09/09/1975  Age: 47 y.o. male    Subjective:     Chief Complaint   Patient presents with    Referral    Colonoscopy     Pt ref from [facility] for colonoscopy screening last colonoscopy 3 yrs ago had polyps in [place]   Maternal uncle colon cancer   Dad had diverticulitis all his life started young      HPI:   Patient was known to me and he had cholecystectomy done and he is doing very well from that standpoint and does not have any complaints.  He claimed that he has a family history of colon cancer and personal history of colon polyps.  His last colonoscopy was done 3 years ago and was told that he needs colonoscopy every 3 years.    Currently she does not have any abdominal pain or GI bleeding and no change in bowel habits or other bowel complaints.  No anorexia or weight loss.    Current Outpatient Medications   Medication Sig    albuterol sulfate (PROVENTIL OR VENTOLIN OR PROAIR) 90 mcg/actuation Inhalation oral inhaler albuterol sulfate HFA 90 mcg/actuation aerosol inhaler   INHALE 2 PUFFS BY MOUTH EVERY 6 HOURS AS NEEDED    busPIRone (BUSPAR) 15 mg Oral Tablet Take 1 Tablet (15 mg total) by mouth Twice daily    DULoxetine (CYMBALTA DR) 60 mg Oral Capsule, Delayed Release(E.C.) duloxetine 60 mg capsule,delayed release   TAKE 1 CAPSULE BY MOUTH ONCE DAILY    fluticasone propion-salmeteroL (ADVAIR) 100-50 mcg/dose Inhalation oral diskus inhaler Advair Diskus 100 mcg-50 mcg/dose powder for inhalation   INHALE 1 PUFF(S) INHALATION TWO TIMES A DAY    gabapentin (NEURONTIN) 600 mg Oral Tablet gabapentin 600 mg tablet   TAKE 1 TABLET BY MOUTH TWICE DAILY    Ibuprofen (MOTRIN) 800 mg Oral Tablet Take 1 Tablet (800 mg total) by mouth Three times a day as needed for Pain    lisinopriL-hydrochlorothiazide (ZESTORETIC) 20-25 mg  Oral Tablet lisinopril 20 mg-hydrochlorothiazide 25 mg tablet   TAKE 1 TABLET BY MOUTH DAILY    meloxicam (MOBIC) 15 mg Oral Tablet Take 1 Tablet (15 mg total) by mouth Once a day    oxyCODONE-acetaminophen (PERCOCET) 10-325 mg Oral tablet TAKE 1 TABLET BY MOUTH THREE TIMES A DAY AS NEEDED FOR 30 DAYS    pantoprazole (PROTONIX) 40 mg Oral Tablet, Delayed Release (E.C.) Take 1 Tablet (40 mg total) by mouth Every night    sodium,potassium,mag sulfates (SUPREP) 17.5-3.13-1.6 gram Oral Recon Soln Take 1 Bottle by mouth Every 12 hours     Allergies   Allergen Reactions    Penicillins  Other Adverse Reaction (Add comment)     Childhood reaction. Patient is unsure of what the medication does.    Childhood allergy    Sulfa (Sulfonamides)      Childhood reaction. Patient is unsure of what the medication does.   Childhood allergy     Past Medical History:   Diagnosis Date    Anxiety     Asthma     Depression     Depressive disorder     GERD (gastroesophageal reflux disease)     HTN (hypertension)     Sleep apnea  Past Surgical History:   Procedure Laterality Date    HX CHOLECYSTECTOMY      SHOULDER OPEN ROTATOR CUFF REPAIR Left     SPINAL FUSION  2021    cage and rods         Family Medical History:       Problem Relation (Age of Onset)    Arthritis-rheumatoid Maternal Grandmother    Asthma Maternal Grandmother    COPD Father    Diverticulitis Father    Pancreatic Cancer Paternal Grandfather            Social History     Tobacco Use    Smoking status: Former     Types: Cigarettes     Quit date: 2002     Years since quitting: 22.0    Smokeless tobacco: Never   Vaping Use    Vaping Use: Never used   Substance Use Topics    Alcohol use: Yes     Comment: occasionally    Drug use: Never       ROS:   Unremarkable  Objective:   Vitals: BP (!) 163/99 (Site: Left, Patient Position: Sitting, Cuff Size: Adult)   Pulse 95   Ht 1.778 m ('5\' 10"'$ )   Wt 119 kg (262 lb)   SpO2 95%   BMI 37.59 kg/m         Physical Exam:  Patient  is awake and alert and fully oriented.  He is overweight.  Blood pressure is elevated.    Neck supple   Lungs are clear   Heart sounds normal  Abdomen is protuberant but soft and nontender.  Rectal examination was deferred.    Assessment & Plan:     ENCOUNTER DIAGNOSES     ICD-10-CM   1. Personal history of colonic polyps  Z86.010   2. Family hx of colon cancer  Z80.0   3. Family history of colon cancer  Z80.0   Patient understands the prep and procedure for colonoscopy that was explained to him and he is scheduled for August 26, 2022    Orders Placed This Encounter    sodium,potassium,mag sulfates (SUPREP) 17.5-3.13-1.6 gram Oral Recon Soln       No follow-ups on file.    Gaspar Garbe, MD

## 2022-08-26 ENCOUNTER — Other Ambulatory Visit: Payer: Self-pay

## 2022-08-26 ENCOUNTER — Ambulatory Visit (HOSPITAL_COMMUNITY): Payer: BC Managed Care – PPO | Admitting: Certified Registered"

## 2022-08-26 ENCOUNTER — Encounter (HOSPITAL_COMMUNITY): Payer: Self-pay | Admitting: Surgery

## 2022-08-26 ENCOUNTER — Encounter (HOSPITAL_COMMUNITY)
Admission: RE | Disposition: A | Discharge status: Home / Self Care | Payer: Self-pay | Source: Ambulatory Visit | Attending: Surgery

## 2022-08-26 ENCOUNTER — Inpatient Hospital Stay
Admission: RE | Admit: 2022-08-26 | Discharge: 2022-08-26 | Disposition: A | Discharge status: Home / Self Care | Payer: BC Managed Care – PPO | Source: Ambulatory Visit | Attending: Surgery | Admitting: Surgery

## 2022-08-26 DIAGNOSIS — Z83719 Family history of colon polyps, unspecified: Secondary | ICD-10-CM

## 2022-08-26 DIAGNOSIS — Z86010 Personal history of colonic polyps: Secondary | ICD-10-CM

## 2022-08-26 DIAGNOSIS — E669 Obesity, unspecified: Secondary | ICD-10-CM | POA: Insufficient documentation

## 2022-08-26 DIAGNOSIS — E785 Hyperlipidemia, unspecified: Secondary | ICD-10-CM | POA: Insufficient documentation

## 2022-08-26 DIAGNOSIS — K5730 Diverticulosis of large intestine without perforation or abscess without bleeding: Secondary | ICD-10-CM

## 2022-08-26 DIAGNOSIS — Z79899 Other long term (current) drug therapy: Secondary | ICD-10-CM | POA: Insufficient documentation

## 2022-08-26 DIAGNOSIS — J45909 Unspecified asthma, uncomplicated: Secondary | ICD-10-CM | POA: Insufficient documentation

## 2022-08-26 DIAGNOSIS — Z800 Family history of malignant neoplasm of digestive organs: Secondary | ICD-10-CM

## 2022-08-26 DIAGNOSIS — I10 Essential (primary) hypertension: Secondary | ICD-10-CM | POA: Insufficient documentation

## 2022-08-26 DIAGNOSIS — K219 Gastro-esophageal reflux disease without esophagitis: Secondary | ICD-10-CM | POA: Insufficient documentation

## 2022-08-26 DIAGNOSIS — G4730 Sleep apnea, unspecified: Secondary | ICD-10-CM | POA: Insufficient documentation

## 2022-08-26 DIAGNOSIS — Z6836 Body mass index (BMI) 36.0-36.9, adult: Secondary | ICD-10-CM | POA: Insufficient documentation

## 2022-08-26 SURGERY — COLONOSCOPY
Anesthesia: General | Wound class: Clean Contaminated Wounds-The respiratory, GI, Genital, or urinary

## 2022-08-26 MED ORDER — LIDOCAINE (PF) 100 MG/5 ML (2 %) INTRAVENOUS SYRINGE
INJECTION | Freq: Once | INTRAVENOUS | Status: DC | PRN
Start: 2022-08-26 — End: 2022-08-26
  Administered 2022-08-26: 100 mg via INTRAVENOUS

## 2022-08-26 MED ORDER — PROPOFOL 10 MG/ML IV BOLUS
INJECTION | Freq: Once | INTRAVENOUS | Status: DC | PRN
Start: 2022-08-26 — End: 2022-08-26
  Administered 2022-08-26 (×3): 100 mg via INTRAVENOUS

## 2022-08-26 MED ORDER — LACTATED RINGERS INTRAVENOUS SOLUTION
INTRAVENOUS | Status: DC | PRN
Start: 2022-08-26 — End: 2022-08-26
  Administered 2022-08-26: 0 via INTRAVENOUS

## 2022-08-26 NOTE — Anesthesia Postprocedure Evaluation (Signed)
Anesthesia Post Op Evaluation    Patient: Lance Hughes  Procedure(s):  COLONOSCOPY    Last Vitals:Temperature: 36.7 C (98.1 F) (08/26/22 0810)  Heart Rate: 79 (08/26/22 0810)  BP (Non-Invasive): 126/75 (08/26/22 0810)  Respiratory Rate: 18 (08/26/22 0810)  SpO2: 96 % (08/26/22 0810)    No notable events documented.    Patient is sufficiently recovered from the effects of anesthesia to participate in the evaluation and has returned to their pre-procedure level.  Patient location during evaluation: PACU       Patient participation: complete - patient participated  Level of consciousness: awake and alert and responsive to verbal stimuli    Pain management: adequate  Airway patency: patent    Anesthetic complications: no  Cardiovascular status: acceptable  Respiratory status: acceptable  Hydration status: acceptable  Patient post-procedure temperature: Pt Normothermic   PONV Status: Absent

## 2022-08-26 NOTE — Anesthesia Preprocedure Evaluation (Signed)
ANESTHESIA PRE-OP EVALUATION  Planned Procedure: COLONOSCOPY  Review of Systems     anesthesia history negative     patient summary reviewed  nursing notes reviewed        Pulmonary   asthma, sleep apnea, CPAP and rescue inhaler,   Cardiovascular    Hypertension, well controlled, ECG reviewed, ACE / ARB inhibitor use and hyperlipidemia , Exercise Tolerance: > or = 4 METS        GI/Hepatic/Renal    GERD        Endo/Other    obesity,      Neuro/Psych/MS   negative neuro/psych ROS,      Cancer    negative hematology/oncology ROS,               Physical Assessment      Airway       Mallampati: III    TM distance: <3 FB    Mouth Opening: good.            Dental       Dentition intact             Pulmonary    Breath sounds clear to auscultation       Cardiovascular    Rhythm: regular  Rate: Normal       Other findings          Plan  ASA 3     Planned anesthesia type: general     general intravenous            SLEEP APNEA  Patient is at risk of obstructive sleep apnea and Education provided regarding risk of obstructive sleep apnea        Intravenous induction       Anesthetic plan and risks discussed with patient  signed consent obtained          Patient's NPO status is appropriate for Anesthesia.

## 2022-08-26 NOTE — OR Surgeon (Signed)
[facility]    OPERATIVE NOTE    Patient Name: Lance Hughes, Lance Hughes MRN:: [id number]  Date of Birth: 09/22/1975  Date of Service: 08/26/2022     Pre-Operative Diagnosis: FAMILY HISTORY AND PERSONAL HISTORY COLON POLYPS     Post-Operative Diagnosis: Normal Colonoscopy    Procedure(s)/Description:  COLONOSCOPY: [id number] (CPT)     Attending Surgeon: Gaspar Garbe, MD     Assistant Surgeon:     Anesthesia Staff:  CRNA: Ardelle Anton, CRNA    Anesthesia Type: .General     Estimated Blood Loss:  Minimal    Complications:  None    Condition:  Stable    Disposition:   PACU - hemodynamically stable.    Indications:  Patient is a 47 y.o. male who presents for colonoscopy due to personal history of colon polyps 3 years ago and family history of colon polyp in uncle .    Description of Procedure/Operation:  Patient was in the left lateral position under sedation.  Digital rectal examination is normal.  Video colonoscope was introduced through the anal opening and advanced without difficulty through sigmoid colon where few small diverticula were noted.  Instrument was further advanced beyond splenic and hepatic flexure all the way down to cecum.  Cecum was well visualized and identified.  Preparation was good.  Instrument was gradually withdrawn visualizing the entire area again.  No polyps or any other mucosal abnormality was noted.  Instrument was removed.  Patient tolerated well.  Because of his history would need repeat colonoscopy in 3 years.  He will be advised high-fiber diet because of developing diverticula.    Gaspar Garbe, MD

## 2022-08-26 NOTE — Discharge Instructions (Signed)
SURGICAL DISCHARGE INSTRUCTIONS     Dr. Gaspar Garbe, MD  performed your COLONOSCOPY today at the [facility]:  Monday through Friday from 8 a.m. - 4 p.m.: [phone]    For T&D: [phone]  Between 4 p.m. - 8 a.m., weekends and holidays:  Call ER [phone]    PLEASE SEE WRITTEN HANDOUTS AS DISCUSSED BY YOUR NURSE    SIGNS AND SYMPTOMS OF A WOUND / INCISION INFECTION   Be sure to watch for the following:  Increase in redness or red streaks near or around the wound or incision.  Increase in pain that is intense or severe and cannot be relieved by the pain medication that your doctor has given you.  Increase in swelling that cannot be relieved by elevation of a body part, or by applying ice, if permitted.  Increase in drainage, or if yellow / green in color and smells bad. This could be on a dressing or a cast.  Increase in fever for longer than 24 hours, or an increase that is higher than 101 degrees Fahrenheit (normal body temperature is 98 degrees Fahrenheit). The incision may feel warm to the touch.    **CALL YOUR DOCTOR IF ONE OR MORE OF THESE SIGNS / SYMPTOMS SHOULD OCCUR.    ANESTHESIA INFORMATION   ANESTHESIA -- ADULT PATIENTS:  You have received intravenous sedation / general anesthesia, and you may feel drowsy and light-headed for several hours. You may even experience some forgetfulness of the procedure. DO NOT DRIVE A MOTOR VEHICLE or perform any activity requiring complete alertness or coordination until you feel fully awake in about 24-48 hours. Do not drink alcoholic beverages for at least 24 hours. Do not stay alone, you must have a responsible adult available to be with you. You may also experience a dry mouth or nausea for 24 hours. This is a normal side effect and will disappear as the effects of the medication wear off.    REMEMBER   If you experience any difficulty breathing, chest pain, bleeding that you feel is excessive, persistent  nausea or vomiting or for any other concerns:  Call your physician Dr.  Gaspar Garbe, MD   at [phone] . You may also ask to have the general doctor on call paged. They are available to you 24 hours a day.      SPECIAL INSTRUCTIONS / COMMENTS   FINDINGS: NORMAL COLONOSCOPY    TODAY, NO DRIVING, OPERATING MACHINERY OR SIGNING LEGAL DOCUMENTS. RETURN TO NORMAL ACTIVITIES TOMORROW  CONTINUE HOME MEDS AND EAT A REGULAR DIET TODAY    FOLLOW-UP APPOINTMENTS   Please call your surgeon's office at the number listed to schedule a date / time of return for follow-up.     Dr Gaspar Garbe

## 2022-12-28 DIAGNOSIS — M7051 Other bursitis of knee, right knee: Secondary | ICD-10-CM | POA: Insufficient documentation

## 2023-01-26 ENCOUNTER — Other Ambulatory Visit: Payer: Self-pay

## 2023-01-26 ENCOUNTER — Emergency Department
Admission: EM | Admit: 2023-01-26 | Discharge: 2023-01-26 | Disposition: A | Discharge status: Home / Self Care | Payer: BC Managed Care – PPO | Attending: Physician Assistant | Admitting: Physician Assistant

## 2023-01-26 ENCOUNTER — Emergency Department (HOSPITAL_COMMUNITY): Payer: BC Managed Care – PPO

## 2023-01-26 ENCOUNTER — Encounter (HOSPITAL_COMMUNITY): Payer: Self-pay

## 2023-01-26 DIAGNOSIS — S20212A Contusion of left front wall of thorax, initial encounter: Secondary | ICD-10-CM | POA: Insufficient documentation

## 2023-01-26 DIAGNOSIS — W208XXA Other cause of strike by thrown, projected or falling object, initial encounter: Secondary | ICD-10-CM | POA: Insufficient documentation

## 2023-01-26 DIAGNOSIS — Z79891 Long term (current) use of opiate analgesic: Secondary | ICD-10-CM | POA: Insufficient documentation

## 2023-01-26 LAB — BASIC METABOLIC PANEL
ANION GAP: 7 mmol/L (ref 4–13)
BUN/CREA RATIO: 28 — ABNORMAL HIGH (ref 6–22)
BUN: 21 mg/dL (ref 7–25)
CALCIUM: 8.8 mg/dL (ref 8.6–10.3)
CHLORIDE: 104 mmol/L (ref 98–107)
CO2 TOTAL: 28 mmol/L (ref 21–31)
CREATININE: 0.75 mg/dL (ref 0.60–1.30)
ESTIMATED GFR: 111 mL/min/{1.73_m2} (ref >59)
GLUCOSE: 91 mg/dL (ref 74–109)
OSMOLALITY, CALCULATED: 280 mOsm/kg (ref 270–290)
POTASSIUM: 3.7 mmol/L (ref 3.5–5.1)
SODIUM: 139 mmol/L (ref 136–145)

## 2023-01-26 LAB — CBC WITH DIFF
BASOPHIL #: 0.1 10*3/uL (ref 0.00–0.10)
BASOPHIL %: 1 % (ref 0–1)
EOSINOPHIL #: 0.1 10*3/uL (ref 0.00–0.50)
EOSINOPHIL %: 2 % (ref 1–8)
HCT: 41.5 % (ref 36.7–47.1)
HGB: 14.6 g/dL (ref 12.5–16.3)
LYMPHOCYTE #: 2.9 10*3/uL (ref 1.00–3.00)
LYMPHOCYTE %: 34 % (ref 16–44)
MCH: 31.7 pg (ref 23.8–33.4)
MCHC: 35 g/dL (ref 32.5–36.3)
MCV: 90.4 fL (ref 73.0–96.2)
MONOCYTE #: 0.6 10*3/uL (ref 0.30–1.00)
MONOCYTE %: 7 % (ref 5–13)
MPV: 8.4 fL (ref 7.4–11.4)
NEUTROPHIL #: 4.9 10*3/uL (ref 1.85–7.80)
NEUTROPHIL %: 57 % (ref 43–77)
PLATELETS: 234 10*3/uL (ref 140–440)
RBC: 4.6 10*6/uL (ref 4.06–5.63)
RDW: 13.1 % (ref 12.1–16.2)
WBC: 8.6 10*3/uL (ref 3.6–10.2)

## 2023-01-26 MED ORDER — IOHEXOL 350 MG IODINE/ML INTRAVENOUS SOLUTION
100.0000 mL | INTRAVENOUS | Status: AC
Start: 2023-01-26 — End: 2023-01-26
  Administered 2023-01-26: 100 mL via INTRAVENOUS

## 2023-01-26 NOTE — Discharge Instructions (Signed)
Continue home Percocet as prescribed. May apply cold or warm compresses to the affected area. Return to the ER for other emergencies or as needed

## 2023-01-26 NOTE — ED Nurses Note (Signed)

## 2023-01-26 NOTE — ED Provider Notes (Signed)
Emergency Medicine      Name: Lance Hughes  Age and Gender: 47 y.o. male  Date of Birth: December 15, 1975  MRN: [id number]  PCP: Lance Bison, FNP-C    CC:  Chief Complaint   Patient presents with    Rib Injury       HPI:  Lance Hughes is a 47 y.o. White male who presents to the ER with rib injury. Patient states he was using a chainsaw and it kicked back on him, striking the left anterior lower chest. He reports ongoing pain and shortness of breath, with difficulty taking a deep breath due to pain. Patient takes Percocet daily for chronic back pain.    Below pertinent information reviewed with patient:  Past Medical History:   Diagnosis Date    Anxiety     Asthma     Depression     Depressive disorder     GERD (gastroesophageal reflux disease)     HTN (hypertension)     Sleep apnea            Allergies   Allergen Reactions    Penicillins  Other Adverse Reaction (Add comment)     Childhood reaction. Patient is unsure of what the medication does.    Childhood allergy    Sulfa (Sulfonamides)      Childhood reaction. Patient is unsure of what the medication does.   Childhood allergy       Past Surgical History:   Procedure Laterality Date    COLONOSCOPY N/A 08/26/2022    Performed by Olevia Perches, MD at [facility] OR T&D    HX CHOLECYSTECTOMY      HX KNEE REPLACMENT Right     LAPAROSCOPIC CHOLECYSTECTOMY N/A 12/06/2021    Performed by Olevia Perches, MD at [facility] OR MAIN    SHOULDER OPEN ROTATOR CUFF REPAIR Left     SPINAL FUSION  2021    cage and rods        Social History     Socioeconomic History    Marital status: Married   Tobacco Use    Smoking status: Former     Current packs/day: 0.00     Types: Cigarettes     Quit date: 2002     Years since quitting: 22.6    Smokeless tobacco: Never   Vaping Use    Vaping status: Never Used   Substance and Sexual Activity    Alcohol use: Yes     Comment: occasionally    Drug use: Never    Sexual activity: Not Currently       ROS:  No other overt positive review of systems are noted other than stated  in the HPI.      Objective:    ED Triage Vitals [01/26/23 1258]   BP (Non-Invasive) (!) 126/90   Heart Rate 66   Respiratory Rate (!) 22   Temperature 36 C (96.8 F)   SpO2 100 %   Weight 118 kg (260 lb)   Height 1.803 m (5\' 11" )     Filed Vitals:    01/26/23 1545 01/26/23 1600 01/26/23 1630 01/26/23 1653   BP:       Pulse:       Resp:    20   Temp:    36.1 C (96.9 F)   SpO2: 96% 96% 97%        Nursing notes and vital signs reviewed.    Constitutional - No acute distress.  Alert and Active.  HEENT -  Normocephalic.  Conjunctiva clear. Moist mucous membranes.   Neck - Trachea midline. No stridor. No hoarseness.  Cardiac - Regular rate and rhythm. No murmurs, rubs, or gallops.   Respiratory/Chest - Normal respiratory effort. Clear to auscultation bilaterally. No rales, wheezes or rhonchi. Tenderness of the left anterior lower chest wall with a small old appearing bruise..  Abdomen - Normal bowel sounds. LUQ tenderness, soft, non-distended. No rebound or guarding.   Musculoskeletal - Good AROM. No clubbing, cyanosis or edema.  Skin - Warm and dry, without any rashes or other lesions.  Neuro - Alert and oriented x 3.  Moving all extremities symmetrically.   Psych - Normal mood and affect. Behavior is normal            Any pertinent labs and imaging obtained during this encounter reviewed below in MDM.    MDM/ED Course:      Medical Decision Making  Patient presents to the ER with rib injury. Patient states he was using a chainsaw and it kicked back on him, striking the left anterior lower chest. He reports ongoing pain and shortness of breath, with difficulty taking a deep breath due to pain. Patient takes Percocet daily for chronic back pain.  Differential diagnoses include rib fracture, contusion, splenic or other visceral injury.  Patient had a chest and rib series which was unremarkable.  However, on exam, he had tenderness in the left upper quadrant as well as the anterior lower chest wall.  CT contrasted studies  of the chest and abdomen were ordered which were unremarkable.  I feel the patient has a chest wall contusion.  He takes Percocet at home and so I feel this is appropriate and he will be discharged in stable condition.    Amount and/or Complexity of Data Reviewed  Labs: ordered. Decision-making details documented in ED Course.  Radiology: ordered. Decision-making details documented in ED Course.  ECG/medicine tests: independent interpretation performed.              ED Course as of 01/26/23 1700   Sun Jan 26, 2023   1355 XR RIBS LEFT W PA/AP CHEST  NO EVIDENCE OF ACUTE RIB FRACTURE OR PNEUMOTHORAX.   1605 CBC/DIFF  normal   1605 BASIC METABOLIC PANEL(!)  normal   1642 CT ANGIO CHEST ABDOMEN PELVIS W IV CONTRAST  NO ACUTE FINDINGS WITHIN THE CHEST, ABDOMEN OR PELVIS.       Orders Placed This Encounter    XR RIBS LEFT W PA/AP CHEST    CT ANGIO CHEST ABDOMEN PELVIS W IV CONTRAST    CBC/DIFF    BASIC METABOLIC PANEL    CBC WITH DIFF    iohexol (OMNIPAQUE 350) infusion         Impression:   Clinical Impression   Contusion of left chest wall, initial encounter (Primary)       Disposition: Discharged    / Maryagnes Amos PA-C  01/26/2023, 14:42  [facility]  Department of Emergency Medicine  [facility]    Portions of this note may have been dictated using voice recognition software.     -----------------------  Results for orders placed or performed during the hospital encounter of 01/26/23 (from the past 12 hour(s))   BASIC METABOLIC PANEL   Result Value Ref Range    SODIUM 139 136 - 145 mmol/L    POTASSIUM 3.7 3.5 - 5.1 mmol/L    CHLORIDE 104 98 - 107 mmol/L    CO2 TOTAL 28 21 -  31 mmol/L    ANION GAP 7 4 - 13 mmol/L    CALCIUM 8.8 8.6 - 10.3 mg/dL    GLUCOSE 91 74 - 742 mg/dL    BUN 21 7 - 25 mg/dL    CREATININE 5.95 6.38 - 1.30 mg/dL    BUN/CREA RATIO 28 (H) 6 - 22    ESTIMATED GFR 111 >59 mL/min/1.40m^2    OSMOLALITY, CALCULATED 280 270 - 290 mOsm/kg   CBC WITH DIFF   Result Value Ref Range     WBC 8.6 3.6 - 10.2 x10^3/uL    RBC 4.60 4.06 - 5.63 x10^6/uL    HGB 14.6 12.5 - 16.3 g/dL    HCT 75.6 43.3 - 29.5 %    MCV 90.4 73.0 - 96.2 fL    MCH 31.7 23.8 - 33.4 pg    MCHC 35.0 32.5 - 36.3 g/dL    RDW 18.8 41.6 - 60.6 %    PLATELETS 234 140 - 440 x10^3/uL    MPV 8.4 7.4 - 11.4 fL    NEUTROPHIL % 57 43 - 77 %    LYMPHOCYTE % 34 16 - 44 %    MONOCYTE % 7 5 - 13 %    EOSINOPHIL % 2 1 - 8 %    BASOPHIL % 1 0 - 1 %    NEUTROPHIL # 4.90 1.85 - 7.80 x10^3/uL    LYMPHOCYTE # 2.90 1.00 - 3.00 x10^3/uL    MONOCYTE # 0.60 0.30 - 1.00 x10^3/uL    EOSINOPHIL # 0.10 0.00 - 0.50 x10^3/uL    BASOPHIL # 0.10 0.00 - 0.10 x10^3/uL     CT ANGIO CHEST ABDOMEN PELVIS W IV CONTRAST   Final Result   NO ACUTE FINDINGS WITHIN THE CHEST, ABDOMEN OR PELVIS.         One or more dose reduction techniques were used (e.g., Automated exposure control, adjustment of the mA and/or kV according to patient size, use of iterative reconstruction technique).         Radiologist location ID: [id number]         XR RIBS LEFT W PA/AP CHEST   Final Result   NO EVIDENCE OF ACUTE RIB FRACTURE OR PNEUMOTHORAX.               Radiologist location ID: [id number]

## 2023-01-26 NOTE — ED Triage Notes (Signed)
Cutting tree on Thursday, kicked back and hit left side of lower chest. Reports ongoing pain and shortness of breath "can't take a deep breath".

## 2023-06-24 ENCOUNTER — Other Ambulatory Visit: Payer: Self-pay

## 2023-06-24 ENCOUNTER — Ambulatory Visit
Admission: RE | Admit: 2023-06-24 | Discharge: 2023-06-24 | Disposition: A | Discharge status: Home / Self Care | Payer: BC Managed Care – PPO | Source: Ambulatory Visit | Attending: NURSE PRACTITIONER | Admitting: NURSE PRACTITIONER

## 2023-06-24 ENCOUNTER — Other Ambulatory Visit (HOSPITAL_COMMUNITY): Payer: Self-pay | Admitting: NURSE PRACTITIONER

## 2023-06-24 DIAGNOSIS — M25542 Pain in joints of left hand: Secondary | ICD-10-CM | POA: Insufficient documentation

## 2023-06-24 DIAGNOSIS — M25541 Pain in joints of right hand: Secondary | ICD-10-CM | POA: Insufficient documentation

## 2023-11-11 ENCOUNTER — Encounter (INDEPENDENT_AMBULATORY_CARE_PROVIDER_SITE_OTHER): Payer: Self-pay | Admitting: OTOLARYNGOLOGY

## 2023-11-11 ENCOUNTER — Ambulatory Visit: Payer: Self-pay | Attending: OTOLARYNGOLOGY | Admitting: OTOLARYNGOLOGY

## 2023-11-11 ENCOUNTER — Other Ambulatory Visit: Payer: Self-pay

## 2023-11-11 VITALS — Ht 71.0 in

## 2023-11-11 DIAGNOSIS — H9202 Otalgia, left ear: Secondary | ICD-10-CM

## 2023-11-11 DIAGNOSIS — H9193 Unspecified hearing loss, bilateral: Secondary | ICD-10-CM

## 2023-11-11 DIAGNOSIS — H6993 Unspecified Eustachian tube disorder, bilateral: Secondary | ICD-10-CM

## 2023-11-11 DIAGNOSIS — Z87891 Personal history of nicotine dependence: Secondary | ICD-10-CM

## 2023-11-11 DIAGNOSIS — J309 Allergic rhinitis, unspecified: Secondary | ICD-10-CM

## 2023-11-11 NOTE — Procedures (Signed)
 ENT, [facility]  [address]  [place] [address]  Operated by [facility]  Procedure Note    Name: Lance Hughes MRN:  [id number]   Date: 11/11/2023 DOB:  Apr 23, 1976 (48 y.o.)         31231 - NASAL ENDOSCOPY DIAGNOSTIC UNILATERAL OR BILATERAL (AMB ONLY)    Performed by: Pennye Boy, DO  Authorized by: Pennye Boy, DO    Time Out:     Immediately before the procedure, a time out was called:  Yes    Patient verified:  Yes    Procedure Verified:  Yes    Site Verified:  Yes  Indications for procedure: Otalgia and Monitor chronic disease    Anesthesia:  Topical lidocaine /phenylephrine    Description: Nasal endoscopy with a flexible rhinolaryngoscope was performed with examination of the  septum, inferior, middle, and superior meatus, turbinates, sphenoethmoidal recess, and nasopharynx. ET orifices and nasopharynx were normal.     Findings: Septal deviation, Inferior turbinate hypertrophy, and Allergic rhinitis    The patient tolerated the procedure well.      This note may have been partially generated using MModal Fluency Direct system, and there may be some incorrect words, spellings, and punctuation that were not noted in checking the note before saving, though effort was made to avoid such errors.      Pennye Boy, DO

## 2023-11-11 NOTE — H&P (Signed)
 ENT, [facility]  [address]  [place] [address]  Operated by [facility]      Name: Lance Hughes MRN:  [id number]   Date: 11/11/2023 DOB: [birth year] (48 y.o.)       Referring Provider:  Lucado, Melissa D, FNP-C    Reason for Visit:   Chief Complaint   Patient presents with    New Patient     Chronic mucoid otitis media   Bil Ear infection for 4 months now   2 rounds of antibiotics         History of Present Illness:  Lance Hughes is a 48 y.o. male who is referred for eval of ears. Pt c/o longstanding ear infections. 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 Pt c/o popping of his ears. Pt states his PCP sees his ears look red. Pt has rare sharp pain to the ears.     Tympanogram: AU Type B      Patient History:  Problem List[1]  Current Outpatient Medications   Medication Sig    albuterol  sulfate (PROVENTIL  OR VENTOLIN  OR PROAIR ) 90 mcg/actuation Inhalation oral inhaler albuterol  sulfate HFA 90 mcg/actuation aerosol inhaler   INHALE 2 PUFFS BY MOUTH EVERY 6 HOURS AS NEEDED    atorvastatin (LIPITOR) 10 mg Oral Tablet Take 1 Tablet (10 mg total) by mouth Daily    busPIRone (BUSPAR) 15 mg Oral Tablet Take 1 Tablet (15 mg total) by mouth Twice daily    diclofenac sodium (VOLTAREN) 50 mg Oral Tablet, Delayed Release (E.C.) Take 1 Tablet (50 mg total) by mouth Twice per day as needed    DULoxetine (CYMBALTA DR) 60 mg Oral Capsule, Delayed Release(E.C.) duloxetine 60 mg capsule,delayed release   TAKE 1 CAPSULE BY MOUTH ONCE DAILY    ergocalciferol, vitamin D2, (DRISDOL) 1,250 mcg (50,000 unit) Oral Capsule Take 1 Capsule (50,000 Units total) by mouth Every 7 days    fluticasone propion-salmeteroL (ADVAIR) 100-50 mcg/dose Inhalation oral diskus inhaler Advair Diskus 100 mcg-50 mcg/dose powder for inhalation   INHALE 1 PUFF(S) INHALATION TWO TIMES A DAY    gabapentin (NEURONTIN) 600 mg Oral Tablet gabapentin 600 mg tablet   TAKE 1 TABLET BY MOUTH TWICE DAILY    lisinopriL-hydrochlorothiazide  (ZESTORETIC) 20-12.5 mg Oral Tablet Take 2 Tablets by mouth Daily    LORazepam (ATIVAN) 0.5 mg Oral Tablet Take 1 Tablet (0.5 mg total) by mouth Twice daily    oxyCODONE-acetaminophen  (PERCOCET) 10-325 mg Oral tablet TAKE 1 TABLET BY MOUTH THREE TIMES A DAY AS NEEDED FOR 30 DAYS    pantoprazole (PROTONIX) 40 mg Oral Tablet, Delayed Release (E.C.) Take 1 Tablet (40 mg total) by mouth Every night      Allergies[2]  Past Medical History:   Diagnosis Date    Anxiety     Asthma     Depression     Depressive disorder     GERD (gastroesophageal reflux disease)     HTN (hypertension)     Sleep apnea      Past Surgical History:   Procedure Laterality Date    HX CHOLECYSTECTOMY      HX KNEE REPLACMENT Right     SHOULDER OPEN ROTATOR CUFF REPAIR Left     SPINAL FUSION  2021    cage and rods     Family Medical History:       Problem Relation (Age of Onset)    Arthritis-rheumatoid Maternal Grandmother    Asthma Maternal Grandmother    COPD Father  Diverticulitis Father    Pancreatic Cancer Paternal Grandfather            Social History[3]    Review of Systems:  Review of Systems    Physical Exam:  Ht 1.803 m (5' 11)   BMI 36.26 kg/m       Physical Exam  Constitutional:       Appearance: Normal appearance. He is well-developed, well-groomed and normal weight.   HENT:      Head: Normocephalic and atraumatic.      Right Ear: Hearing, ear canal and external ear normal. Tympanic membrane is scarred.      Left Ear: Hearing, ear canal and external ear normal. Tympanic membrane is scarred.      Nose: Septal deviation and mucosal edema present.      Right Turbinates: Enlarged.      Left Turbinates: Enlarged.      Mouth/Throat:      Lips: Pink.      Mouth: Mucous membranes are moist.      Pharynx: Oropharynx is clear. Uvula midline.   Eyes:      Extraocular Movements: Extraocular movements intact.   Neck:      Trachea: Phonation normal.   Pulmonary:      Effort: Pulmonary effort is normal.   Musculoskeletal:      Cervical back:  Normal range of motion and neck supple.   Lymphadenopathy:      Cervical: No cervical adenopathy.   Skin:     General: Skin is warm.   Neurological:      Mental Status: He is alert and oriented to person, place, and time.      Cranial Nerves: Cranial nerves 2-12 are intact. No facial asymmetry.   Psychiatric:         Attention and Perception: Attention normal.         Mood and Affect: Mood normal.         Speech: Speech normal.         Behavior: Behavior normal. Behavior is cooperative.          Assessment:  ENCOUNTER DIAGNOSES     ICD-10-CM   1. Dysfunction of both eustachian tubes  H69.93   2. Bilateral hearing loss, unspecified hearing loss type  H91.93   3. Chronic allergic rhinitis  J30.9   4. Otalgia, left  H92.02       Plan:  Referral note and Medical records reviewed on 11/11/2023.  In office tympanograms reviewed and interpreted  Order audiogram  Flonase daily  Nasal saline b.i.d.  Orders Placed This Encounter    40981 - NASAL ENDOSCOPY DIAGNOSTIC UNILATERAL OR BILATERAL (AMB ONLY)    AMB [facility] REFERRAL EXTERNAL AUDIOLOGIST    POCT HEARING/VISION/TYMPANOGRAM (AMB ONLY)     Follow-up after audiogram or sooner [facility]    Evans Him, PA-C  The advanced practice clinician's documentation was reviewed and amended in its entirety.  The patient was seen by me and examined as part of a shared service with the advanced practice clinician. The patient was clinically evaluated, and all pertinent records, lab results and imaging were reviewed.  I agree with the advanced practice clinicians documentation, which reflects my medical decision making.  I personally spent more than 50% of the encounter with direct patient care including: obtaining history, performing physical exam, counseling the patient and family, and directing medical decision making.    Pennye Boy, D.O., MMS  ENT / Facial Plastic Surgery    I appreciate  the opportunity to be involved in the care of your patients.  If you have any questions or concerns  regarding this encounter, please do not hesitate to contact me at your convenience.     This note may have been partially generated using MModal Fluency Direct system, and there may be some incorrect words, spellings, and punctuation that were not noted in checking the note before saving, though effort was made to avoid such errors.         [1]   Patient Active Problem List  Diagnosis    Abnormal computed tomography scan    Adenomatous polyp of descending colon    Patellofemoral syndrome of left knee    Asthma    Chronic pain disorder    Degeneration of lumbar intervertebral disc    Esophageal thickening    Gastroesophageal reflux disease without esophagitis    History of lumbar fusion    Hypertensive disorder    Lumbar post-laminectomy syndrome    Lumbar radiculopathy    Radial tear of medial meniscus    Sacroiliac joint pain    Spondylolisthesis of lumbar region    Stress fracture of tibia    Cholelithiasis    Right upper quadrant pain    Postoperative visit    Degenerative arthritis of right knee    Family history of colon cancer    Personal history of colonic polyps    Pes anserinus bursitis of right knee   [2]   Allergies  Allergen Reactions    Penicillins  Other Adverse Reaction (Add comment)     Childhood reaction. Patient is unsure of what the medication does.    Childhood allergy    Other Reaction(s): Not available    Sulfa (Sulfonamides)      Childhood reaction. Patient is unsure of what the medication does.   Childhood allergy   [3]   Social History  Tobacco Use    Smoking status: Former     Current packs/day: 0.00     Types: Cigarettes     Quit date: 2002     Years since quitting: 23.4    Smokeless tobacco: Never   Vaping Use    Vaping status: Never Used   Substance Use Topics    Alcohol use: Yes     Comment: occasionally    Drug use: Never

## 2023-11-12 ENCOUNTER — Ambulatory Visit (INDEPENDENT_AMBULATORY_CARE_PROVIDER_SITE_OTHER): Payer: Self-pay | Admitting: OTOLARYNGOLOGY

## 2023-11-13 ENCOUNTER — Other Ambulatory Visit (HOSPITAL_COMMUNITY): Payer: Self-pay | Admitting: NURSE PRACTITIONER

## 2023-11-13 DIAGNOSIS — N50812 Left testicular pain: Secondary | ICD-10-CM

## 2023-11-14 ENCOUNTER — Encounter (HOSPITAL_COMMUNITY): Payer: Self-pay

## 2023-12-02 ENCOUNTER — Other Ambulatory Visit: Payer: Self-pay

## 2023-12-02 ENCOUNTER — Inpatient Hospital Stay (INDEPENDENT_AMBULATORY_CARE_PROVIDER_SITE_OTHER)
Admission: RE | Admit: 2023-12-02 | Discharge: 2023-12-02 | Disposition: A | Discharge status: Home / Self Care | Payer: Self-pay | Source: Ambulatory Visit | Attending: NURSE PRACTITIONER

## 2023-12-02 ENCOUNTER — Other Ambulatory Visit (HOSPITAL_COMMUNITY): Payer: Self-pay | Admitting: NURSE PRACTITIONER

## 2023-12-02 DIAGNOSIS — N50812 Left testicular pain: Secondary | ICD-10-CM

## 2024-02-03 ENCOUNTER — Other Ambulatory Visit: Payer: Self-pay

## 2024-02-03 ENCOUNTER — Ambulatory Visit: Attending: PSYCHIATRY AND NEUROLOGY-NEUROLOGY

## 2024-02-03 DIAGNOSIS — M79642 Pain in left hand: Secondary | ICD-10-CM | POA: Insufficient documentation

## 2024-02-03 DIAGNOSIS — M79641 Pain in right hand: Secondary | ICD-10-CM | POA: Insufficient documentation

## 2024-02-03 LAB — THYROID STIMULATING HORMONE (SENSITIVE TSH): TSH: 1.081 u[IU]/mL (ref 0.450–5.330)

## 2024-02-03 LAB — VITAMIN B12: VITAMIN B 12: 381 pg/mL (ref 180–914)

## 2024-02-03 LAB — FOLATE: FOLATE: 16.5 ng/mL (ref 5.9–24.8)

## 2024-03-10 ENCOUNTER — Other Ambulatory Visit (HOSPITAL_COMMUNITY): Payer: Self-pay

## 2024-03-10 ENCOUNTER — Other Ambulatory Visit: Payer: Self-pay

## 2024-03-10 ENCOUNTER — Ambulatory Visit (HOSPITAL_COMMUNITY): Admission: RE | Admit: 2024-03-10 | Discharge: 2024-03-10 | Disposition: A | Source: Ambulatory Visit

## 2024-03-10 ENCOUNTER — Encounter (INDEPENDENT_AMBULATORY_CARE_PROVIDER_SITE_OTHER): Payer: Self-pay | Admitting: OTOLARYNGOLOGY

## 2024-03-10 ENCOUNTER — Ambulatory Visit: Payer: Self-pay | Attending: OTOLARYNGOLOGY | Admitting: OTOLARYNGOLOGY

## 2024-03-10 VITALS — Ht 71.0 in

## 2024-03-10 DIAGNOSIS — H8111 Benign paroxysmal vertigo, right ear: Secondary | ICD-10-CM | POA: Insufficient documentation

## 2024-03-10 DIAGNOSIS — H9202 Otalgia, left ear: Secondary | ICD-10-CM | POA: Insufficient documentation

## 2024-03-10 DIAGNOSIS — M961 Postlaminectomy syndrome, not elsewhere classified: Secondary | ICD-10-CM

## 2024-03-10 DIAGNOSIS — J309 Allergic rhinitis, unspecified: Secondary | ICD-10-CM | POA: Insufficient documentation

## 2024-03-10 DIAGNOSIS — Z9622 Myringotomy tube(s) status: Secondary | ICD-10-CM

## 2024-03-10 DIAGNOSIS — H9193 Unspecified hearing loss, bilateral: Secondary | ICD-10-CM | POA: Insufficient documentation

## 2024-03-10 DIAGNOSIS — Z981 Arthrodesis status: Secondary | ICD-10-CM

## 2024-03-10 DIAGNOSIS — H6993 Unspecified Eustachian tube disorder, bilateral: Secondary | ICD-10-CM | POA: Insufficient documentation

## 2024-03-10 DIAGNOSIS — M488X7 Other specified spondylopathies, lumbosacral region: Secondary | ICD-10-CM

## 2024-03-10 DIAGNOSIS — Z8669 Personal history of other diseases of the nervous system and sense organs: Secondary | ICD-10-CM

## 2024-03-10 DIAGNOSIS — M4804 Spinal stenosis, thoracic region: Secondary | ICD-10-CM

## 2024-03-10 MED ORDER — FLUTICASONE PROPIONATE 50 MCG/ACTUATION NASAL SPRAY,SUSPENSION
2.0000 | Freq: Every day | NASAL | 11 refills | Status: AC
Start: 2024-03-10 — End: ?

## 2024-03-10 NOTE — H&P (Signed)
 ENT, [facility]  [address]  [place] [address]  Operated by [facility]      Name: Lance Hughes MRN:  [id number]   Date: 03/10/2024 DOB: 24-Nov-1975 (48 y.o.)       Referring Provider:  Lucado, Melissa D, APRN, CNP    Reason for Visit:   Chief Complaint   Patient presents with    Follow-up After Testing     Audio [facility] and balance 01/05/2024 in media         History of Present Illness:  Lance Hughes is a 48 y.o. male who presents for follow-up on chronic Eustachian tube dysfunction/AR.  Here to review recent audiogram.  Started on Flonase, and ear symptoms have improved some.  As a recap, Pt c/o longstanding ear infections. 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 Pt c/o popping of his ears.  Occasional sharp pains in his ears.  Recently he has started having room spinning sensation mainly when he tilts his head back to the right are around position changes in the bedroom.  Symptoms do not last very long.      Audiogram AU within normal limit to mild hearing loss, type a tympanograms (my interpretation)              Patient History:  Problem List[1]  Current Outpatient Medications   Medication Sig    albuterol  sulfate (PROVENTIL  OR VENTOLIN  OR PROAIR ) 90 mcg/actuation Inhalation oral inhaler albuterol  sulfate HFA 90 mcg/actuation aerosol inhaler   INHALE 2 PUFFS BY MOUTH EVERY 6 HOURS AS NEEDED    atorvastatin (LIPITOR) 10 mg Oral Tablet Take 1 Tablet (10 mg total) by mouth Daily    busPIRone (BUSPAR) 15 mg Oral Tablet Take 1 Tablet (15 mg total) by mouth Twice daily    diclofenac sodium (VOLTAREN) 50 mg Oral Tablet, Delayed Release (E.C.) Take 1 Tablet (50 mg total) by mouth Twice per day as needed    DULoxetine (CYMBALTA DR) 60 mg Oral Capsule, Delayed Release(E.C.) duloxetine 60 mg capsule,delayed release   TAKE 1 CAPSULE BY MOUTH ONCE DAILY    ergocalciferol, vitamin D2, (DRISDOL) 1,250 mcg (50,000 unit) Oral Capsule Take 1 Capsule (50,000 Units total) by mouth Every 7 days     fluticasone propion-salmeteroL (ADVAIR) 100-50 mcg/dose Inhalation oral diskus inhaler Advair Diskus 100 mcg-50 mcg/dose powder for inhalation   INHALE 1 PUFF(S) INHALATION TWO TIMES A DAY    fluticasone propionate (FLONASE) 50 mcg/actuation Nasal Spray, Suspension Administer 2 Sprays into each nostril Daily    gabapentin (NEURONTIN) 600 mg Oral Tablet gabapentin 600 mg tablet   TAKE 1 TABLET BY MOUTH TWICE DAILY    lisinopriL-hydrochlorothiazide (ZESTORETIC) 20-12.5 mg Oral Tablet Take 2 Tablets by mouth Daily    LORazepam (ATIVAN) 0.5 mg Oral Tablet Take 1 Tablet (0.5 mg total) by mouth Twice daily    oxyCODONE-acetaminophen  (PERCOCET) 10-325 mg Oral tablet TAKE 1 TABLET BY MOUTH THREE TIMES A DAY AS NEEDED FOR 30 DAYS    pantoprazole (PROTONIX) 40 mg Oral Tablet, Delayed Release (E.C.) Take 1 Tablet (40 mg total) by mouth Every night      Allergies[2]  Past Medical History:   Diagnosis Date    Anxiety     Asthma     Depression     Depressive disorder     GERD (gastroesophageal reflux disease)     HTN (hypertension)     Sleep apnea      Past Surgical History:   Procedure Laterality Date  HX CHOLECYSTECTOMY      HX KNEE REPLACMENT Right     SHOULDER OPEN ROTATOR CUFF REPAIR Left     SPINAL FUSION  2021    cage and rods     Family Medical History:       Problem Relation (Age of Onset)    Arthritis-rheumatoid Maternal Grandmother    Asthma Maternal Grandmother    COPD Father    Diverticulitis Father    Pancreatic Cancer Paternal Grandfather            Social History[3]    Review of Systems:  Review of Systems    Physical Exam:  Ht 1.803 m (5' 11)   BMI 36.26 kg/m       Physical Exam  Constitutional:       Appearance: Normal appearance. He is well-developed, well-groomed and normal weight.   HENT:      Head: Normocephalic and atraumatic.      Right Ear: Hearing, ear canal and external ear normal. Tympanic membrane is scarred.      Left Ear: Hearing, ear canal and external ear normal. Tympanic membrane is  scarred.      Nose: Septal deviation and mucosal edema present.      Right Turbinates: Enlarged.      Left Turbinates: Enlarged.      Mouth/Throat:      Lips: Pink.      Mouth: Mucous membranes are moist.      Pharynx: Oropharynx is clear. Uvula midline.   Eyes:      Extraocular Movements: Extraocular movements intact.   Neck:      Trachea: Phonation normal.   Pulmonary:      Effort: Pulmonary effort is normal.   Musculoskeletal:      Cervical back: Normal range of motion and neck supple.   Lymphadenopathy:      Cervical: No cervical adenopathy.   Skin:     General: Skin is warm.   Neurological:      Mental Status: He is alert and oriented to person, place, and time.      Cranial Nerves: Cranial nerves 2-12 are intact. No facial asymmetry.   Psychiatric:         Attention and Perception: Attention normal.         Mood and Affect: Mood normal.         Speech: Speech normal.         Behavior: Behavior normal. Behavior is cooperative.          Assessment:  ENCOUNTER DIAGNOSES     ICD-10-CM   1. Dysfunction of both eustachian tubes  H69.93   2. Bilateral hearing loss, unspecified hearing loss type  H91.93   3. Chronic allergic rhinitis  J30.9   4. Otalgia, left  H92.02   5. BPPV (benign paroxysmal positional vertigo), right  H81.11         Plan:  -Medical records reviewed on 03/10/2024.  -Audiogram reviewed and interpreted myself  -Tympanograms improved from prior, continue medical management for now with Flonase if symptoms worsen or recurrent symptoms to consider BMT  -Refer to [facility] for balance screen/BPPV treatment  -BPPV exercises recommend  -3.Flonase daily  -3.Nasal saline b.i.d.  Orders Placed This Encounter    [facility]    CANCELED: POCT HEARING/VISION/TYMPANOGRAM (AMB ONLY)    fluticasone propionate (FLONASE) 50 mcg/actuation Nasal Spray, Suspension     Follow-up 4 months or sooner PRN  Oneil Alvine, D.O., MMS  ENT / Facial Plastic Surgery    I appreciate the  opportunity to be involved in the care of your patients.  If you have any questions or concerns regarding this encounter, please do not hesitate to contact me at your convenience.     This note may have been partially generated using MModal Fluency Direct system, and there may be some incorrect words, spellings, and punctuation that were not noted in checking the note before saving, though effort was made to avoid such errors.           [1]   Patient Active Problem List  Diagnosis    Abnormal computed tomography scan    Adenomatous polyp of descending colon    Patellofemoral syndrome of left knee    Asthma    Chronic pain disorder    Degeneration of lumbar intervertebral disc    Esophageal thickening    Gastroesophageal reflux disease without esophagitis    History of lumbar fusion    Hypertensive disorder    Lumbar post-laminectomy syndrome    Lumbar radiculopathy    Radial tear of medial meniscus    Sacroiliac joint pain    Spondylolisthesis of lumbar region    Stress fracture of tibia    Cholelithiasis    Right upper quadrant pain    Postoperative visit    Degenerative arthritis of right knee    Family history of colon cancer    History of colonic polyps    Pes anserinus bursitis of right knee   [2]   Allergies  Allergen Reactions    Penicillins  Other Adverse Reaction (Add comment)     Childhood reaction. Patient is unsure of what the medication does.    Childhood allergy    Other Reaction(s): Not available    Sulfa (Sulfonamides)      Childhood reaction. Patient is unsure of what the medication does.   Childhood allergy   [3]   Social History  Tobacco Use    Smoking status: Former     Current packs/day: 0.00     Types: Cigarettes     Quit date: 2002     Years since quitting: 23.8    Smokeless tobacco: Never   Vaping Use    Vaping status: Never Used   Substance Use Topics    Alcohol use: Yes     Comment: occasionally    Drug use: Never

## 2024-07-13 ENCOUNTER — Ambulatory Visit (INDEPENDENT_AMBULATORY_CARE_PROVIDER_SITE_OTHER): Payer: Self-pay | Admitting: OTOLARYNGOLOGY
